# Patient Record
Sex: Female | Born: 1940
Health system: Southern US, Community
[De-identification: ages and names within clinical notes are randomized; demographics above are authoritative.]

## PROBLEM LIST (undated history)

## (undated) DIAGNOSIS — G2581 Restless legs syndrome: Secondary | ICD-10-CM

## (undated) DIAGNOSIS — E669 Obesity, unspecified: Secondary | ICD-10-CM

## (undated) DIAGNOSIS — K219 Gastro-esophageal reflux disease without esophagitis: Secondary | ICD-10-CM

## (undated) DIAGNOSIS — J449 Chronic obstructive pulmonary disease, unspecified: Secondary | ICD-10-CM

## (undated) DIAGNOSIS — I259 Chronic ischemic heart disease, unspecified: Secondary | ICD-10-CM

## (undated) DIAGNOSIS — Z8711 Personal history of peptic ulcer disease: Secondary | ICD-10-CM

## (undated) DIAGNOSIS — I509 Heart failure, unspecified: Secondary | ICD-10-CM

## (undated) DIAGNOSIS — F0390 Unspecified dementia without behavioral disturbance: Secondary | ICD-10-CM

## (undated) DIAGNOSIS — E785 Hyperlipidemia, unspecified: Secondary | ICD-10-CM

## (undated) DIAGNOSIS — E538 Deficiency of other specified B group vitamins: Secondary | ICD-10-CM

## (undated) DIAGNOSIS — I10 Essential (primary) hypertension: Secondary | ICD-10-CM

## (undated) DIAGNOSIS — D649 Anemia, unspecified: Secondary | ICD-10-CM

## (undated) DIAGNOSIS — N183 Chronic kidney disease, stage 3 unspecified: Secondary | ICD-10-CM

## (undated) DIAGNOSIS — M1990 Unspecified osteoarthritis, unspecified site: Secondary | ICD-10-CM

## (undated) DIAGNOSIS — Z8719 Personal history of other diseases of the digestive system: Secondary | ICD-10-CM

## (undated) HISTORY — DX: Anemia, unspecified: D64.9

## (undated) HISTORY — DX: Personal history of other diseases of the digestive system: Z87.19

## (undated) HISTORY — DX: Restless legs syndrome: G25.81

## (undated) HISTORY — DX: Deficiency of other specified B group vitamins: E53.8

## (undated) HISTORY — PX: SHOULDER SURGERY: SHX246

## (undated) HISTORY — DX: Hyperlipidemia, unspecified: E78.5

## (undated) HISTORY — DX: Gastro-esophageal reflux disease without esophagitis: K21.9

## (undated) HISTORY — DX: Unspecified dementia, unspecified severity, without behavioral disturbance, psychotic disturbance, mood disturbance, and anxiety: F03.90

## (undated) HISTORY — DX: Personal history of peptic ulcer disease: Z87.11

## (undated) HISTORY — DX: Chronic obstructive pulmonary disease, unspecified: J44.9

## (undated) HISTORY — PX: OTHER SURGICAL HISTORY: SHX169

## (undated) HISTORY — DX: Unspecified osteoarthritis, unspecified site: M19.90

## (undated) HISTORY — DX: Heart failure, unspecified: I50.9

## (undated) HISTORY — DX: Chronic ischemic heart disease, unspecified: I25.9

## (undated) HISTORY — PX: APPENDECTOMY: SHX54

## (undated) HISTORY — PX: LEG SURGERY: SHX1003

## (undated) HISTORY — PX: CHOLECYSTECTOMY: SHX55

## (undated) HISTORY — DX: Chronic kidney disease, stage 3 unspecified: N18.30

## (undated) HISTORY — DX: Obesity, unspecified: E66.9

---

## 2014-10-21 ENCOUNTER — Encounter: Payer: Self-pay | Admitting: Gastroenterology

## 2015-01-03 DIAGNOSIS — R509 Fever, unspecified: Secondary | ICD-10-CM | POA: Diagnosis not present

## 2015-01-03 DIAGNOSIS — J441 Chronic obstructive pulmonary disease with (acute) exacerbation: Secondary | ICD-10-CM | POA: Diagnosis not present

## 2015-01-03 DIAGNOSIS — M1990 Unspecified osteoarthritis, unspecified site: Secondary | ICD-10-CM | POA: Diagnosis not present

## 2015-01-03 DIAGNOSIS — Z87891 Personal history of nicotine dependence: Secondary | ICD-10-CM | POA: Diagnosis not present

## 2015-01-03 DIAGNOSIS — R0602 Shortness of breath: Secondary | ICD-10-CM | POA: Diagnosis not present

## 2015-01-03 DIAGNOSIS — I10 Essential (primary) hypertension: Secondary | ICD-10-CM | POA: Diagnosis not present

## 2015-01-03 DIAGNOSIS — E780 Pure hypercholesterolemia: Secondary | ICD-10-CM | POA: Diagnosis not present

## 2015-01-03 DIAGNOSIS — J209 Acute bronchitis, unspecified: Secondary | ICD-10-CM | POA: Diagnosis not present

## 2015-01-03 DIAGNOSIS — I2510 Atherosclerotic heart disease of native coronary artery without angina pectoris: Secondary | ICD-10-CM | POA: Diagnosis not present

## 2015-01-03 DIAGNOSIS — Z7722 Contact with and (suspected) exposure to environmental tobacco smoke (acute) (chronic): Secondary | ICD-10-CM | POA: Diagnosis not present

## 2015-01-03 DIAGNOSIS — J439 Emphysema, unspecified: Secondary | ICD-10-CM | POA: Diagnosis not present

## 2015-01-04 DIAGNOSIS — J441 Chronic obstructive pulmonary disease with (acute) exacerbation: Secondary | ICD-10-CM | POA: Diagnosis not present

## 2015-01-04 DIAGNOSIS — Z6835 Body mass index (BMI) 35.0-35.9, adult: Secondary | ICD-10-CM | POA: Diagnosis not present

## 2015-01-04 DIAGNOSIS — Z9981 Dependence on supplemental oxygen: Secondary | ICD-10-CM | POA: Diagnosis not present

## 2015-02-15 DIAGNOSIS — J441 Chronic obstructive pulmonary disease with (acute) exacerbation: Secondary | ICD-10-CM | POA: Diagnosis not present

## 2015-02-25 DIAGNOSIS — D519 Vitamin B12 deficiency anemia, unspecified: Secondary | ICD-10-CM | POA: Diagnosis not present

## 2015-03-02 DIAGNOSIS — I071 Rheumatic tricuspid insufficiency: Secondary | ICD-10-CM | POA: Diagnosis not present

## 2015-03-02 DIAGNOSIS — I5020 Unspecified systolic (congestive) heart failure: Secondary | ICD-10-CM | POA: Diagnosis not present

## 2015-03-02 DIAGNOSIS — I313 Pericardial effusion (noninflammatory): Secondary | ICD-10-CM | POA: Diagnosis not present

## 2015-03-10 DIAGNOSIS — R32 Unspecified urinary incontinence: Secondary | ICD-10-CM | POA: Diagnosis not present

## 2015-03-11 DIAGNOSIS — R0602 Shortness of breath: Secondary | ICD-10-CM | POA: Diagnosis not present

## 2015-03-11 DIAGNOSIS — J309 Allergic rhinitis, unspecified: Secondary | ICD-10-CM | POA: Diagnosis not present

## 2015-03-11 DIAGNOSIS — R600 Localized edema: Secondary | ICD-10-CM | POA: Diagnosis not present

## 2015-03-11 DIAGNOSIS — J449 Chronic obstructive pulmonary disease, unspecified: Secondary | ICD-10-CM | POA: Diagnosis not present

## 2015-03-11 DIAGNOSIS — Z6834 Body mass index (BMI) 34.0-34.9, adult: Secondary | ICD-10-CM | POA: Diagnosis not present

## 2015-03-11 DIAGNOSIS — J90 Pleural effusion, not elsewhere classified: Secondary | ICD-10-CM | POA: Diagnosis not present

## 2015-03-30 DIAGNOSIS — D519 Vitamin B12 deficiency anemia, unspecified: Secondary | ICD-10-CM | POA: Diagnosis not present

## 2015-04-07 DIAGNOSIS — J411 Mucopurulent chronic bronchitis: Secondary | ICD-10-CM | POA: Diagnosis not present

## 2015-04-08 DIAGNOSIS — Z9981 Dependence on supplemental oxygen: Secondary | ICD-10-CM | POA: Diagnosis not present

## 2015-04-08 DIAGNOSIS — J441 Chronic obstructive pulmonary disease with (acute) exacerbation: Secondary | ICD-10-CM | POA: Diagnosis not present

## 2015-04-08 DIAGNOSIS — R600 Localized edema: Secondary | ICD-10-CM | POA: Diagnosis not present

## 2015-04-08 DIAGNOSIS — I129 Hypertensive chronic kidney disease with stage 1 through stage 4 chronic kidney disease, or unspecified chronic kidney disease: Secondary | ICD-10-CM | POA: Diagnosis not present

## 2015-05-06 DIAGNOSIS — D519 Vitamin B12 deficiency anemia, unspecified: Secondary | ICD-10-CM | POA: Diagnosis not present

## 2015-05-07 DIAGNOSIS — J411 Mucopurulent chronic bronchitis: Secondary | ICD-10-CM | POA: Diagnosis not present

## 2015-06-07 DIAGNOSIS — J411 Mucopurulent chronic bronchitis: Secondary | ICD-10-CM | POA: Diagnosis not present

## 2015-07-07 DIAGNOSIS — R32 Unspecified urinary incontinence: Secondary | ICD-10-CM | POA: Diagnosis not present

## 2015-07-07 DIAGNOSIS — J411 Mucopurulent chronic bronchitis: Secondary | ICD-10-CM | POA: Diagnosis not present

## 2015-07-19 DIAGNOSIS — R32 Unspecified urinary incontinence: Secondary | ICD-10-CM | POA: Diagnosis not present

## 2015-07-20 DIAGNOSIS — D509 Iron deficiency anemia, unspecified: Secondary | ICD-10-CM | POA: Diagnosis not present

## 2015-07-20 DIAGNOSIS — D519 Vitamin B12 deficiency anemia, unspecified: Secondary | ICD-10-CM | POA: Diagnosis not present

## 2015-07-20 DIAGNOSIS — N390 Urinary tract infection, site not specified: Secondary | ICD-10-CM | POA: Diagnosis not present

## 2015-07-20 DIAGNOSIS — R109 Unspecified abdominal pain: Secondary | ICD-10-CM | POA: Diagnosis not present

## 2015-07-31 DIAGNOSIS — R32 Unspecified urinary incontinence: Secondary | ICD-10-CM | POA: Diagnosis not present

## 2015-08-07 DIAGNOSIS — J411 Mucopurulent chronic bronchitis: Secondary | ICD-10-CM | POA: Diagnosis not present

## 2015-08-26 DIAGNOSIS — R600 Localized edema: Secondary | ICD-10-CM | POA: Diagnosis not present

## 2015-08-26 DIAGNOSIS — R32 Unspecified urinary incontinence: Secondary | ICD-10-CM | POA: Diagnosis not present

## 2015-08-26 DIAGNOSIS — L928 Other granulomatous disorders of the skin and subcutaneous tissue: Secondary | ICD-10-CM | POA: Diagnosis not present

## 2015-08-26 DIAGNOSIS — K449 Diaphragmatic hernia without obstruction or gangrene: Secondary | ICD-10-CM | POA: Diagnosis not present

## 2015-08-26 DIAGNOSIS — R109 Unspecified abdominal pain: Secondary | ICD-10-CM | POA: Diagnosis not present

## 2015-08-26 DIAGNOSIS — R1031 Right lower quadrant pain: Secondary | ICD-10-CM | POA: Diagnosis not present

## 2015-08-26 DIAGNOSIS — R319 Hematuria, unspecified: Secondary | ICD-10-CM | POA: Diagnosis not present

## 2015-09-03 DIAGNOSIS — R32 Unspecified urinary incontinence: Secondary | ICD-10-CM | POA: Diagnosis not present

## 2015-09-07 DIAGNOSIS — J411 Mucopurulent chronic bronchitis: Secondary | ICD-10-CM | POA: Diagnosis not present

## 2015-09-08 DIAGNOSIS — S39011A Strain of muscle, fascia and tendon of abdomen, initial encounter: Secondary | ICD-10-CM | POA: Diagnosis not present

## 2015-09-08 DIAGNOSIS — J449 Chronic obstructive pulmonary disease, unspecified: Secondary | ICD-10-CM | POA: Diagnosis not present

## 2015-09-08 DIAGNOSIS — R109 Unspecified abdominal pain: Secondary | ICD-10-CM | POA: Diagnosis not present

## 2015-09-08 DIAGNOSIS — D519 Vitamin B12 deficiency anemia, unspecified: Secondary | ICD-10-CM | POA: Diagnosis not present

## 2015-09-08 DIAGNOSIS — R1031 Right lower quadrant pain: Secondary | ICD-10-CM | POA: Diagnosis not present

## 2015-09-08 DIAGNOSIS — R1011 Right upper quadrant pain: Secondary | ICD-10-CM | POA: Diagnosis not present

## 2015-09-08 DIAGNOSIS — R319 Hematuria, unspecified: Secondary | ICD-10-CM | POA: Diagnosis not present

## 2015-09-24 DIAGNOSIS — R32 Unspecified urinary incontinence: Secondary | ICD-10-CM | POA: Diagnosis not present

## 2015-09-30 DIAGNOSIS — D519 Vitamin B12 deficiency anemia, unspecified: Secondary | ICD-10-CM | POA: Diagnosis not present

## 2015-09-30 DIAGNOSIS — Z23 Encounter for immunization: Secondary | ICD-10-CM | POA: Diagnosis not present

## 2015-10-01 DIAGNOSIS — R32 Unspecified urinary incontinence: Secondary | ICD-10-CM | POA: Diagnosis not present

## 2015-10-07 DIAGNOSIS — J411 Mucopurulent chronic bronchitis: Secondary | ICD-10-CM | POA: Diagnosis not present

## 2015-10-11 DIAGNOSIS — K648 Other hemorrhoids: Secondary | ICD-10-CM | POA: Diagnosis not present

## 2015-10-11 DIAGNOSIS — K5900 Constipation, unspecified: Secondary | ICD-10-CM | POA: Diagnosis not present

## 2015-10-11 DIAGNOSIS — K219 Gastro-esophageal reflux disease without esophagitis: Secondary | ICD-10-CM | POA: Diagnosis not present

## 2015-10-11 DIAGNOSIS — K222 Esophageal obstruction: Secondary | ICD-10-CM | POA: Diagnosis not present

## 2015-11-07 DIAGNOSIS — J411 Mucopurulent chronic bronchitis: Secondary | ICD-10-CM | POA: Diagnosis not present

## 2015-11-24 DIAGNOSIS — Z9181 History of falling: Secondary | ICD-10-CM | POA: Diagnosis not present

## 2015-11-24 DIAGNOSIS — Z139 Encounter for screening, unspecified: Secondary | ICD-10-CM | POA: Diagnosis not present

## 2015-11-24 DIAGNOSIS — Z1389 Encounter for screening for other disorder: Secondary | ICD-10-CM | POA: Diagnosis not present

## 2015-11-24 DIAGNOSIS — H579 Unspecified disorder of eye and adnexa: Secondary | ICD-10-CM | POA: Diagnosis not present

## 2015-11-24 DIAGNOSIS — J441 Chronic obstructive pulmonary disease with (acute) exacerbation: Secondary | ICD-10-CM | POA: Diagnosis not present

## 2015-11-24 DIAGNOSIS — Z0000 Encounter for general adult medical examination without abnormal findings: Secondary | ICD-10-CM | POA: Diagnosis not present

## 2015-12-06 DIAGNOSIS — I517 Cardiomegaly: Secondary | ICD-10-CM | POA: Diagnosis not present

## 2015-12-06 DIAGNOSIS — R05 Cough: Secondary | ICD-10-CM | POA: Diagnosis not present

## 2015-12-06 DIAGNOSIS — R918 Other nonspecific abnormal finding of lung field: Secondary | ICD-10-CM | POA: Diagnosis not present

## 2015-12-06 DIAGNOSIS — Z6835 Body mass index (BMI) 35.0-35.9, adult: Secondary | ICD-10-CM | POA: Diagnosis not present

## 2015-12-07 DIAGNOSIS — J411 Mucopurulent chronic bronchitis: Secondary | ICD-10-CM | POA: Diagnosis not present

## 2015-12-08 DIAGNOSIS — N182 Chronic kidney disease, stage 2 (mild): Secondary | ICD-10-CM | POA: Diagnosis not present

## 2015-12-08 DIAGNOSIS — I129 Hypertensive chronic kidney disease with stage 1 through stage 4 chronic kidney disease, or unspecified chronic kidney disease: Secondary | ICD-10-CM | POA: Diagnosis not present

## 2015-12-08 DIAGNOSIS — E782 Mixed hyperlipidemia: Secondary | ICD-10-CM | POA: Diagnosis not present

## 2015-12-16 DIAGNOSIS — J449 Chronic obstructive pulmonary disease, unspecified: Secondary | ICD-10-CM | POA: Diagnosis not present

## 2015-12-16 DIAGNOSIS — I10 Essential (primary) hypertension: Secondary | ICD-10-CM | POA: Diagnosis not present

## 2015-12-16 DIAGNOSIS — I2510 Atherosclerotic heart disease of native coronary artery without angina pectoris: Secondary | ICD-10-CM | POA: Diagnosis not present

## 2015-12-16 DIAGNOSIS — E785 Hyperlipidemia, unspecified: Secondary | ICD-10-CM | POA: Diagnosis not present

## 2015-12-17 DIAGNOSIS — D509 Iron deficiency anemia, unspecified: Secondary | ICD-10-CM | POA: Diagnosis not present

## 2015-12-30 DIAGNOSIS — R32 Unspecified urinary incontinence: Secondary | ICD-10-CM | POA: Diagnosis not present

## 2016-01-07 DIAGNOSIS — H2512 Age-related nuclear cataract, left eye: Secondary | ICD-10-CM | POA: Diagnosis not present

## 2016-01-07 DIAGNOSIS — J411 Mucopurulent chronic bronchitis: Secondary | ICD-10-CM | POA: Diagnosis not present

## 2016-01-19 DIAGNOSIS — D509 Iron deficiency anemia, unspecified: Secondary | ICD-10-CM | POA: Diagnosis not present

## 2016-01-21 DIAGNOSIS — Z9981 Dependence on supplemental oxygen: Secondary | ICD-10-CM | POA: Diagnosis not present

## 2016-01-21 DIAGNOSIS — Z6834 Body mass index (BMI) 34.0-34.9, adult: Secondary | ICD-10-CM | POA: Diagnosis not present

## 2016-01-21 DIAGNOSIS — J9610 Chronic respiratory failure, unspecified whether with hypoxia or hypercapnia: Secondary | ICD-10-CM | POA: Diagnosis not present

## 2016-01-21 DIAGNOSIS — J441 Chronic obstructive pulmonary disease with (acute) exacerbation: Secondary | ICD-10-CM | POA: Diagnosis not present

## 2016-01-26 DIAGNOSIS — D509 Iron deficiency anemia, unspecified: Secondary | ICD-10-CM | POA: Diagnosis not present

## 2016-02-07 DIAGNOSIS — J411 Mucopurulent chronic bronchitis: Secondary | ICD-10-CM | POA: Diagnosis not present

## 2016-02-15 DIAGNOSIS — J449 Chronic obstructive pulmonary disease, unspecified: Secondary | ICD-10-CM | POA: Diagnosis not present

## 2016-02-24 DIAGNOSIS — F0150 Vascular dementia without behavioral disturbance: Secondary | ICD-10-CM | POA: Diagnosis not present

## 2016-02-24 DIAGNOSIS — M7500 Adhesive capsulitis of unspecified shoulder: Secondary | ICD-10-CM | POA: Diagnosis not present

## 2016-02-24 DIAGNOSIS — Z6833 Body mass index (BMI) 33.0-33.9, adult: Secondary | ICD-10-CM | POA: Diagnosis not present

## 2016-02-24 DIAGNOSIS — J449 Chronic obstructive pulmonary disease, unspecified: Secondary | ICD-10-CM | POA: Diagnosis not present

## 2016-02-28 DIAGNOSIS — S42309A Unspecified fracture of shaft of humerus, unspecified arm, initial encounter for closed fracture: Secondary | ICD-10-CM | POA: Diagnosis not present

## 2016-02-28 DIAGNOSIS — R32 Unspecified urinary incontinence: Secondary | ICD-10-CM | POA: Diagnosis not present

## 2016-03-03 DIAGNOSIS — D509 Iron deficiency anemia, unspecified: Secondary | ICD-10-CM | POA: Diagnosis not present

## 2016-03-06 DIAGNOSIS — J411 Mucopurulent chronic bronchitis: Secondary | ICD-10-CM | POA: Diagnosis not present

## 2016-03-08 DIAGNOSIS — D519 Vitamin B12 deficiency anemia, unspecified: Secondary | ICD-10-CM | POA: Diagnosis not present

## 2016-03-14 DIAGNOSIS — J449 Chronic obstructive pulmonary disease, unspecified: Secondary | ICD-10-CM | POA: Diagnosis not present

## 2016-04-06 DIAGNOSIS — J411 Mucopurulent chronic bronchitis: Secondary | ICD-10-CM | POA: Diagnosis not present

## 2016-04-06 DIAGNOSIS — D519 Vitamin B12 deficiency anemia, unspecified: Secondary | ICD-10-CM | POA: Diagnosis not present

## 2016-04-14 DIAGNOSIS — J449 Chronic obstructive pulmonary disease, unspecified: Secondary | ICD-10-CM | POA: Diagnosis not present

## 2016-04-20 DIAGNOSIS — Z6834 Body mass index (BMI) 34.0-34.9, adult: Secondary | ICD-10-CM | POA: Diagnosis not present

## 2016-04-20 DIAGNOSIS — F0150 Vascular dementia without behavioral disturbance: Secondary | ICD-10-CM | POA: Diagnosis not present

## 2016-04-28 DIAGNOSIS — R32 Unspecified urinary incontinence: Secondary | ICD-10-CM | POA: Diagnosis not present

## 2016-05-06 DIAGNOSIS — J411 Mucopurulent chronic bronchitis: Secondary | ICD-10-CM | POA: Diagnosis not present

## 2016-05-10 DIAGNOSIS — D519 Vitamin B12 deficiency anemia, unspecified: Secondary | ICD-10-CM | POA: Diagnosis not present

## 2016-05-14 DIAGNOSIS — J449 Chronic obstructive pulmonary disease, unspecified: Secondary | ICD-10-CM | POA: Diagnosis not present

## 2016-05-19 DIAGNOSIS — R600 Localized edema: Secondary | ICD-10-CM | POA: Diagnosis not present

## 2016-05-19 DIAGNOSIS — D509 Iron deficiency anemia, unspecified: Secondary | ICD-10-CM | POA: Diagnosis not present

## 2016-05-19 DIAGNOSIS — Z9981 Dependence on supplemental oxygen: Secondary | ICD-10-CM | POA: Diagnosis not present

## 2016-05-19 DIAGNOSIS — J9610 Chronic respiratory failure, unspecified whether with hypoxia or hypercapnia: Secondary | ICD-10-CM | POA: Diagnosis not present

## 2016-06-01 DIAGNOSIS — D509 Iron deficiency anemia, unspecified: Secondary | ICD-10-CM | POA: Diagnosis not present

## 2016-06-02 DIAGNOSIS — D509 Iron deficiency anemia, unspecified: Secondary | ICD-10-CM | POA: Diagnosis not present

## 2016-06-06 DIAGNOSIS — J411 Mucopurulent chronic bronchitis: Secondary | ICD-10-CM | POA: Diagnosis not present

## 2016-06-08 DIAGNOSIS — D519 Vitamin B12 deficiency anemia, unspecified: Secondary | ICD-10-CM | POA: Diagnosis not present

## 2016-06-09 DIAGNOSIS — D509 Iron deficiency anemia, unspecified: Secondary | ICD-10-CM | POA: Diagnosis not present

## 2016-06-14 DIAGNOSIS — J449 Chronic obstructive pulmonary disease, unspecified: Secondary | ICD-10-CM | POA: Diagnosis not present

## 2016-06-14 DIAGNOSIS — D509 Iron deficiency anemia, unspecified: Secondary | ICD-10-CM | POA: Diagnosis not present

## 2016-06-21 DIAGNOSIS — R32 Unspecified urinary incontinence: Secondary | ICD-10-CM | POA: Diagnosis not present

## 2016-07-03 DIAGNOSIS — F0391 Unspecified dementia with behavioral disturbance: Secondary | ICD-10-CM | POA: Diagnosis not present

## 2016-07-03 DIAGNOSIS — D519 Vitamin B12 deficiency anemia, unspecified: Secondary | ICD-10-CM | POA: Diagnosis not present

## 2016-07-03 DIAGNOSIS — Z139 Encounter for screening, unspecified: Secondary | ICD-10-CM | POA: Diagnosis not present

## 2016-07-03 DIAGNOSIS — R600 Localized edema: Secondary | ICD-10-CM | POA: Diagnosis not present

## 2016-07-03 DIAGNOSIS — Z7189 Other specified counseling: Secondary | ICD-10-CM | POA: Diagnosis not present

## 2016-07-03 DIAGNOSIS — Z0000 Encounter for general adult medical examination without abnormal findings: Secondary | ICD-10-CM | POA: Diagnosis not present

## 2016-07-03 DIAGNOSIS — Z1389 Encounter for screening for other disorder: Secondary | ICD-10-CM | POA: Diagnosis not present

## 2016-07-06 DIAGNOSIS — J411 Mucopurulent chronic bronchitis: Secondary | ICD-10-CM | POA: Diagnosis not present

## 2016-07-14 DIAGNOSIS — J449 Chronic obstructive pulmonary disease, unspecified: Secondary | ICD-10-CM | POA: Diagnosis not present

## 2016-08-06 DIAGNOSIS — J411 Mucopurulent chronic bronchitis: Secondary | ICD-10-CM | POA: Diagnosis not present

## 2016-08-14 DIAGNOSIS — J449 Chronic obstructive pulmonary disease, unspecified: Secondary | ICD-10-CM | POA: Diagnosis not present

## 2016-08-29 DIAGNOSIS — R32 Unspecified urinary incontinence: Secondary | ICD-10-CM | POA: Diagnosis not present

## 2016-09-04 DIAGNOSIS — N390 Urinary tract infection, site not specified: Secondary | ICD-10-CM | POA: Diagnosis not present

## 2016-09-04 DIAGNOSIS — N183 Chronic kidney disease, stage 3 (moderate): Secondary | ICD-10-CM | POA: Diagnosis not present

## 2016-09-04 DIAGNOSIS — D509 Iron deficiency anemia, unspecified: Secondary | ICD-10-CM | POA: Diagnosis not present

## 2016-09-04 DIAGNOSIS — Z6835 Body mass index (BMI) 35.0-35.9, adult: Secondary | ICD-10-CM | POA: Diagnosis not present

## 2016-09-04 DIAGNOSIS — M549 Dorsalgia, unspecified: Secondary | ICD-10-CM | POA: Diagnosis not present

## 2016-09-06 DIAGNOSIS — J411 Mucopurulent chronic bronchitis: Secondary | ICD-10-CM | POA: Diagnosis not present

## 2016-09-14 DIAGNOSIS — J449 Chronic obstructive pulmonary disease, unspecified: Secondary | ICD-10-CM | POA: Diagnosis not present

## 2016-09-29 DIAGNOSIS — D519 Vitamin B12 deficiency anemia, unspecified: Secondary | ICD-10-CM | POA: Diagnosis not present

## 2016-10-06 DIAGNOSIS — J411 Mucopurulent chronic bronchitis: Secondary | ICD-10-CM | POA: Diagnosis not present

## 2016-10-14 DIAGNOSIS — J449 Chronic obstructive pulmonary disease, unspecified: Secondary | ICD-10-CM | POA: Diagnosis not present

## 2016-10-25 DIAGNOSIS — R32 Unspecified urinary incontinence: Secondary | ICD-10-CM | POA: Diagnosis not present

## 2016-11-06 DIAGNOSIS — J411 Mucopurulent chronic bronchitis: Secondary | ICD-10-CM | POA: Diagnosis not present

## 2016-11-08 DIAGNOSIS — J321 Chronic frontal sinusitis: Secondary | ICD-10-CM | POA: Diagnosis not present

## 2016-11-08 DIAGNOSIS — Z6834 Body mass index (BMI) 34.0-34.9, adult: Secondary | ICD-10-CM | POA: Diagnosis not present

## 2016-11-14 DIAGNOSIS — J449 Chronic obstructive pulmonary disease, unspecified: Secondary | ICD-10-CM | POA: Diagnosis not present

## 2016-12-06 DIAGNOSIS — J411 Mucopurulent chronic bronchitis: Secondary | ICD-10-CM | POA: Diagnosis not present

## 2016-12-07 DIAGNOSIS — Z6835 Body mass index (BMI) 35.0-35.9, adult: Secondary | ICD-10-CM | POA: Diagnosis not present

## 2016-12-07 DIAGNOSIS — J320 Chronic maxillary sinusitis: Secondary | ICD-10-CM | POA: Diagnosis not present

## 2016-12-10 DIAGNOSIS — J441 Chronic obstructive pulmonary disease with (acute) exacerbation: Secondary | ICD-10-CM | POA: Diagnosis not present

## 2016-12-10 DIAGNOSIS — R0602 Shortness of breath: Secondary | ICD-10-CM | POA: Diagnosis not present

## 2016-12-10 DIAGNOSIS — Z9981 Dependence on supplemental oxygen: Secondary | ICD-10-CM | POA: Diagnosis not present

## 2016-12-10 DIAGNOSIS — R05 Cough: Secondary | ICD-10-CM | POA: Diagnosis not present

## 2016-12-14 DIAGNOSIS — J449 Chronic obstructive pulmonary disease, unspecified: Secondary | ICD-10-CM | POA: Diagnosis not present

## 2016-12-27 DIAGNOSIS — R32 Unspecified urinary incontinence: Secondary | ICD-10-CM | POA: Diagnosis not present

## 2017-01-06 DIAGNOSIS — J411 Mucopurulent chronic bronchitis: Secondary | ICD-10-CM | POA: Diagnosis not present

## 2017-01-14 DIAGNOSIS — J449 Chronic obstructive pulmonary disease, unspecified: Secondary | ICD-10-CM | POA: Diagnosis not present

## 2017-01-17 DIAGNOSIS — G2581 Restless legs syndrome: Secondary | ICD-10-CM | POA: Diagnosis not present

## 2017-01-17 DIAGNOSIS — M170 Bilateral primary osteoarthritis of knee: Secondary | ICD-10-CM | POA: Diagnosis not present

## 2017-01-20 DIAGNOSIS — G4489 Other headache syndrome: Secondary | ICD-10-CM | POA: Diagnosis not present

## 2017-01-20 DIAGNOSIS — R404 Transient alteration of awareness: Secondary | ICD-10-CM | POA: Diagnosis not present

## 2017-01-20 DIAGNOSIS — D72820 Lymphocytosis (symptomatic): Secondary | ICD-10-CM | POA: Diagnosis not present

## 2017-01-20 DIAGNOSIS — R509 Fever, unspecified: Secondary | ICD-10-CM | POA: Diagnosis not present

## 2017-01-20 DIAGNOSIS — K317 Polyp of stomach and duodenum: Secondary | ICD-10-CM | POA: Diagnosis not present

## 2017-01-20 DIAGNOSIS — I10 Essential (primary) hypertension: Secondary | ICD-10-CM | POA: Diagnosis not present

## 2017-01-20 DIAGNOSIS — I252 Old myocardial infarction: Secondary | ICD-10-CM | POA: Diagnosis not present

## 2017-01-20 DIAGNOSIS — M069 Rheumatoid arthritis, unspecified: Secondary | ICD-10-CM | POA: Diagnosis not present

## 2017-01-20 DIAGNOSIS — K920 Hematemesis: Secondary | ICD-10-CM | POA: Diagnosis not present

## 2017-01-20 DIAGNOSIS — R195 Other fecal abnormalities: Secondary | ICD-10-CM

## 2017-01-20 DIAGNOSIS — K219 Gastro-esophageal reflux disease without esophagitis: Secondary | ICD-10-CM | POA: Diagnosis not present

## 2017-01-20 DIAGNOSIS — R531 Weakness: Secondary | ICD-10-CM | POA: Diagnosis not present

## 2017-01-20 DIAGNOSIS — K254 Chronic or unspecified gastric ulcer with hemorrhage: Secondary | ICD-10-CM | POA: Diagnosis not present

## 2017-01-20 DIAGNOSIS — K449 Diaphragmatic hernia without obstruction or gangrene: Secondary | ICD-10-CM | POA: Diagnosis not present

## 2017-01-20 DIAGNOSIS — D509 Iron deficiency anemia, unspecified: Secondary | ICD-10-CM | POA: Diagnosis not present

## 2017-01-20 DIAGNOSIS — R81 Glycosuria: Secondary | ICD-10-CM | POA: Diagnosis not present

## 2017-01-20 DIAGNOSIS — D690 Allergic purpura: Secondary | ICD-10-CM | POA: Diagnosis not present

## 2017-01-20 DIAGNOSIS — R51 Headache: Secondary | ICD-10-CM | POA: Diagnosis not present

## 2017-01-20 DIAGNOSIS — J449 Chronic obstructive pulmonary disease, unspecified: Secondary | ICD-10-CM | POA: Diagnosis not present

## 2017-01-20 DIAGNOSIS — D649 Anemia, unspecified: Secondary | ICD-10-CM | POA: Diagnosis not present

## 2017-01-20 DIAGNOSIS — K259 Gastric ulcer, unspecified as acute or chronic, without hemorrhage or perforation: Secondary | ICD-10-CM | POA: Diagnosis not present

## 2017-01-20 DIAGNOSIS — D62 Acute posthemorrhagic anemia: Secondary | ICD-10-CM | POA: Diagnosis not present

## 2017-01-21 DIAGNOSIS — K317 Polyp of stomach and duodenum: Secondary | ICD-10-CM | POA: Diagnosis not present

## 2017-01-21 DIAGNOSIS — D72820 Lymphocytosis (symptomatic): Secondary | ICD-10-CM | POA: Diagnosis not present

## 2017-01-25 DIAGNOSIS — D649 Anemia, unspecified: Secondary | ICD-10-CM | POA: Diagnosis not present

## 2017-01-25 DIAGNOSIS — Z6834 Body mass index (BMI) 34.0-34.9, adult: Secondary | ICD-10-CM | POA: Diagnosis not present

## 2017-01-25 DIAGNOSIS — K922 Gastrointestinal hemorrhage, unspecified: Secondary | ICD-10-CM | POA: Diagnosis not present

## 2017-01-26 DIAGNOSIS — R195 Other fecal abnormalities: Secondary | ICD-10-CM | POA: Diagnosis not present

## 2017-01-26 DIAGNOSIS — M069 Rheumatoid arthritis, unspecified: Secondary | ICD-10-CM | POA: Diagnosis not present

## 2017-01-26 DIAGNOSIS — I252 Old myocardial infarction: Secondary | ICD-10-CM | POA: Diagnosis not present

## 2017-01-26 DIAGNOSIS — J449 Chronic obstructive pulmonary disease, unspecified: Secondary | ICD-10-CM | POA: Diagnosis not present

## 2017-01-26 DIAGNOSIS — D509 Iron deficiency anemia, unspecified: Secondary | ICD-10-CM | POA: Diagnosis not present

## 2017-01-26 DIAGNOSIS — I10 Essential (primary) hypertension: Secondary | ICD-10-CM | POA: Diagnosis not present

## 2017-01-26 DIAGNOSIS — F0390 Unspecified dementia without behavioral disturbance: Secondary | ICD-10-CM | POA: Diagnosis not present

## 2017-01-26 DIAGNOSIS — Z87891 Personal history of nicotine dependence: Secondary | ICD-10-CM | POA: Diagnosis not present

## 2017-01-26 DIAGNOSIS — Z79899 Other long term (current) drug therapy: Secondary | ICD-10-CM | POA: Diagnosis not present

## 2017-01-27 DIAGNOSIS — Z87891 Personal history of nicotine dependence: Secondary | ICD-10-CM | POA: Diagnosis not present

## 2017-01-27 DIAGNOSIS — R195 Other fecal abnormalities: Secondary | ICD-10-CM | POA: Diagnosis not present

## 2017-01-27 DIAGNOSIS — J449 Chronic obstructive pulmonary disease, unspecified: Secondary | ICD-10-CM | POA: Diagnosis not present

## 2017-01-27 DIAGNOSIS — Z79899 Other long term (current) drug therapy: Secondary | ICD-10-CM | POA: Diagnosis not present

## 2017-01-27 DIAGNOSIS — F0390 Unspecified dementia without behavioral disturbance: Secondary | ICD-10-CM | POA: Diagnosis not present

## 2017-01-27 DIAGNOSIS — I10 Essential (primary) hypertension: Secondary | ICD-10-CM | POA: Diagnosis not present

## 2017-01-27 DIAGNOSIS — I252 Old myocardial infarction: Secondary | ICD-10-CM | POA: Diagnosis not present

## 2017-01-27 DIAGNOSIS — D509 Iron deficiency anemia, unspecified: Secondary | ICD-10-CM | POA: Diagnosis not present

## 2017-01-27 DIAGNOSIS — M069 Rheumatoid arthritis, unspecified: Secondary | ICD-10-CM | POA: Diagnosis not present

## 2017-01-27 DIAGNOSIS — D649 Anemia, unspecified: Secondary | ICD-10-CM | POA: Diagnosis not present

## 2017-01-28 DIAGNOSIS — M069 Rheumatoid arthritis, unspecified: Secondary | ICD-10-CM | POA: Diagnosis not present

## 2017-01-28 DIAGNOSIS — I252 Old myocardial infarction: Secondary | ICD-10-CM | POA: Diagnosis not present

## 2017-01-28 DIAGNOSIS — D509 Iron deficiency anemia, unspecified: Secondary | ICD-10-CM | POA: Diagnosis not present

## 2017-01-28 DIAGNOSIS — Z79899 Other long term (current) drug therapy: Secondary | ICD-10-CM | POA: Diagnosis not present

## 2017-01-28 DIAGNOSIS — Z87891 Personal history of nicotine dependence: Secondary | ICD-10-CM | POA: Diagnosis not present

## 2017-01-28 DIAGNOSIS — F0390 Unspecified dementia without behavioral disturbance: Secondary | ICD-10-CM | POA: Diagnosis not present

## 2017-01-28 DIAGNOSIS — J449 Chronic obstructive pulmonary disease, unspecified: Secondary | ICD-10-CM | POA: Diagnosis not present

## 2017-01-28 DIAGNOSIS — I10 Essential (primary) hypertension: Secondary | ICD-10-CM | POA: Diagnosis not present

## 2017-01-28 DIAGNOSIS — R195 Other fecal abnormalities: Secondary | ICD-10-CM | POA: Diagnosis not present

## 2017-02-06 DIAGNOSIS — R1032 Left lower quadrant pain: Secondary | ICD-10-CM | POA: Diagnosis not present

## 2017-02-06 DIAGNOSIS — Z8711 Personal history of peptic ulcer disease: Secondary | ICD-10-CM | POA: Diagnosis not present

## 2017-02-06 DIAGNOSIS — J411 Mucopurulent chronic bronchitis: Secondary | ICD-10-CM | POA: Diagnosis not present

## 2017-02-06 DIAGNOSIS — D500 Iron deficiency anemia secondary to blood loss (chronic): Secondary | ICD-10-CM | POA: Diagnosis not present

## 2017-02-06 DIAGNOSIS — D649 Anemia, unspecified: Secondary | ICD-10-CM | POA: Diagnosis not present

## 2017-02-06 DIAGNOSIS — Z8719 Personal history of other diseases of the digestive system: Secondary | ICD-10-CM | POA: Diagnosis not present

## 2017-02-14 DIAGNOSIS — E784 Other hyperlipidemia: Secondary | ICD-10-CM | POA: Diagnosis not present

## 2017-02-14 DIAGNOSIS — I5020 Unspecified systolic (congestive) heart failure: Secondary | ICD-10-CM | POA: Diagnosis not present

## 2017-02-14 DIAGNOSIS — I10 Essential (primary) hypertension: Secondary | ICD-10-CM | POA: Diagnosis not present

## 2017-02-14 DIAGNOSIS — J410 Simple chronic bronchitis: Secondary | ICD-10-CM | POA: Diagnosis not present

## 2017-02-14 DIAGNOSIS — R0609 Other forms of dyspnea: Secondary | ICD-10-CM | POA: Diagnosis not present

## 2017-02-14 DIAGNOSIS — J449 Chronic obstructive pulmonary disease, unspecified: Secondary | ICD-10-CM | POA: Diagnosis not present

## 2017-02-14 DIAGNOSIS — Z01818 Encounter for other preprocedural examination: Secondary | ICD-10-CM | POA: Diagnosis not present

## 2017-02-19 DIAGNOSIS — R0609 Other forms of dyspnea: Secondary | ICD-10-CM | POA: Diagnosis not present

## 2017-02-19 DIAGNOSIS — J410 Simple chronic bronchitis: Secondary | ICD-10-CM | POA: Diagnosis not present

## 2017-02-19 DIAGNOSIS — I5020 Unspecified systolic (congestive) heart failure: Secondary | ICD-10-CM | POA: Diagnosis not present

## 2017-02-19 DIAGNOSIS — Z01818 Encounter for other preprocedural examination: Secondary | ICD-10-CM | POA: Diagnosis not present

## 2017-02-19 DIAGNOSIS — I10 Essential (primary) hypertension: Secondary | ICD-10-CM | POA: Diagnosis not present

## 2017-02-19 DIAGNOSIS — E784 Other hyperlipidemia: Secondary | ICD-10-CM | POA: Diagnosis not present

## 2017-02-20 DIAGNOSIS — R1032 Left lower quadrant pain: Secondary | ICD-10-CM | POA: Diagnosis not present

## 2017-02-20 DIAGNOSIS — R109 Unspecified abdominal pain: Secondary | ICD-10-CM | POA: Diagnosis not present

## 2017-02-20 DIAGNOSIS — D500 Iron deficiency anemia secondary to blood loss (chronic): Secondary | ICD-10-CM | POA: Diagnosis not present

## 2017-02-23 DIAGNOSIS — D500 Iron deficiency anemia secondary to blood loss (chronic): Secondary | ICD-10-CM | POA: Diagnosis not present

## 2017-02-26 DIAGNOSIS — R32 Unspecified urinary incontinence: Secondary | ICD-10-CM | POA: Diagnosis not present

## 2017-02-27 DIAGNOSIS — R32 Unspecified urinary incontinence: Secondary | ICD-10-CM | POA: Diagnosis not present

## 2017-03-06 DIAGNOSIS — J411 Mucopurulent chronic bronchitis: Secondary | ICD-10-CM | POA: Diagnosis not present

## 2017-03-08 DIAGNOSIS — D500 Iron deficiency anemia secondary to blood loss (chronic): Secondary | ICD-10-CM | POA: Diagnosis not present

## 2017-03-09 DIAGNOSIS — J029 Acute pharyngitis, unspecified: Secondary | ICD-10-CM | POA: Diagnosis not present

## 2017-03-09 DIAGNOSIS — J069 Acute upper respiratory infection, unspecified: Secondary | ICD-10-CM | POA: Diagnosis not present

## 2017-03-09 DIAGNOSIS — D508 Other iron deficiency anemias: Secondary | ICD-10-CM | POA: Diagnosis not present

## 2017-03-09 DIAGNOSIS — R05 Cough: Secondary | ICD-10-CM | POA: Diagnosis not present

## 2017-03-12 DIAGNOSIS — D509 Iron deficiency anemia, unspecified: Secondary | ICD-10-CM | POA: Diagnosis not present

## 2017-03-12 DIAGNOSIS — I2510 Atherosclerotic heart disease of native coronary artery without angina pectoris: Secondary | ICD-10-CM | POA: Diagnosis not present

## 2017-03-12 DIAGNOSIS — R0602 Shortness of breath: Secondary | ICD-10-CM | POA: Diagnosis not present

## 2017-03-12 DIAGNOSIS — K219 Gastro-esophageal reflux disease without esophagitis: Secondary | ICD-10-CM | POA: Diagnosis not present

## 2017-03-12 DIAGNOSIS — F0390 Unspecified dementia without behavioral disturbance: Secondary | ICD-10-CM | POA: Diagnosis not present

## 2017-03-12 DIAGNOSIS — D62 Acute posthemorrhagic anemia: Secondary | ICD-10-CM | POA: Diagnosis not present

## 2017-03-12 DIAGNOSIS — J449 Chronic obstructive pulmonary disease, unspecified: Secondary | ICD-10-CM | POA: Diagnosis not present

## 2017-03-12 DIAGNOSIS — E7800 Pure hypercholesterolemia, unspecified: Secondary | ICD-10-CM | POA: Diagnosis not present

## 2017-03-12 DIAGNOSIS — K922 Gastrointestinal hemorrhage, unspecified: Secondary | ICD-10-CM | POA: Diagnosis not present

## 2017-03-12 DIAGNOSIS — I10 Essential (primary) hypertension: Secondary | ICD-10-CM | POA: Diagnosis not present

## 2017-03-13 DIAGNOSIS — D509 Iron deficiency anemia, unspecified: Secondary | ICD-10-CM | POA: Diagnosis not present

## 2017-03-13 DIAGNOSIS — I6523 Occlusion and stenosis of bilateral carotid arteries: Secondary | ICD-10-CM | POA: Diagnosis not present

## 2017-03-14 DIAGNOSIS — D509 Iron deficiency anemia, unspecified: Secondary | ICD-10-CM | POA: Diagnosis not present

## 2017-03-14 DIAGNOSIS — K922 Gastrointestinal hemorrhage, unspecified: Secondary | ICD-10-CM | POA: Diagnosis not present

## 2017-03-14 DIAGNOSIS — J449 Chronic obstructive pulmonary disease, unspecified: Secondary | ICD-10-CM | POA: Diagnosis not present

## 2017-03-14 DIAGNOSIS — D62 Acute posthemorrhagic anemia: Secondary | ICD-10-CM | POA: Diagnosis not present

## 2017-03-15 DIAGNOSIS — D509 Iron deficiency anemia, unspecified: Secondary | ICD-10-CM | POA: Diagnosis not present

## 2017-03-15 DIAGNOSIS — D62 Acute posthemorrhagic anemia: Secondary | ICD-10-CM | POA: Diagnosis not present

## 2017-03-15 DIAGNOSIS — K922 Gastrointestinal hemorrhage, unspecified: Secondary | ICD-10-CM | POA: Diagnosis not present

## 2017-03-15 DIAGNOSIS — J449 Chronic obstructive pulmonary disease, unspecified: Secondary | ICD-10-CM | POA: Diagnosis not present

## 2017-03-21 DIAGNOSIS — D509 Iron deficiency anemia, unspecified: Secondary | ICD-10-CM | POA: Diagnosis not present

## 2017-03-21 DIAGNOSIS — E784 Other hyperlipidemia: Secondary | ICD-10-CM | POA: Diagnosis not present

## 2017-03-21 DIAGNOSIS — I10 Essential (primary) hypertension: Secondary | ICD-10-CM | POA: Diagnosis not present

## 2017-03-21 DIAGNOSIS — R0609 Other forms of dyspnea: Secondary | ICD-10-CM | POA: Diagnosis not present

## 2017-03-22 DIAGNOSIS — Z6833 Body mass index (BMI) 33.0-33.9, adult: Secondary | ICD-10-CM | POA: Diagnosis not present

## 2017-03-22 DIAGNOSIS — N8500 Endometrial hyperplasia, unspecified: Secondary | ICD-10-CM | POA: Diagnosis not present

## 2017-03-22 DIAGNOSIS — D509 Iron deficiency anemia, unspecified: Secondary | ICD-10-CM | POA: Diagnosis not present

## 2017-04-06 DIAGNOSIS — J411 Mucopurulent chronic bronchitis: Secondary | ICD-10-CM | POA: Diagnosis not present

## 2017-04-14 DIAGNOSIS — J449 Chronic obstructive pulmonary disease, unspecified: Secondary | ICD-10-CM | POA: Diagnosis not present

## 2017-04-18 DIAGNOSIS — D509 Iron deficiency anemia, unspecified: Secondary | ICD-10-CM | POA: Diagnosis not present

## 2017-04-18 DIAGNOSIS — Z6831 Body mass index (BMI) 31.0-31.9, adult: Secondary | ICD-10-CM | POA: Diagnosis not present

## 2017-04-18 DIAGNOSIS — J441 Chronic obstructive pulmonary disease with (acute) exacerbation: Secondary | ICD-10-CM | POA: Diagnosis not present

## 2017-04-23 DIAGNOSIS — D500 Iron deficiency anemia secondary to blood loss (chronic): Secondary | ICD-10-CM | POA: Diagnosis not present

## 2017-04-23 DIAGNOSIS — J441 Chronic obstructive pulmonary disease with (acute) exacerbation: Secondary | ICD-10-CM | POA: Diagnosis not present

## 2017-04-23 DIAGNOSIS — R32 Unspecified urinary incontinence: Secondary | ICD-10-CM | POA: Diagnosis not present

## 2017-04-24 DIAGNOSIS — R32 Unspecified urinary incontinence: Secondary | ICD-10-CM | POA: Diagnosis not present

## 2017-05-05 DIAGNOSIS — F0390 Unspecified dementia without behavioral disturbance: Secondary | ICD-10-CM | POA: Diagnosis not present

## 2017-05-05 DIAGNOSIS — K2970 Gastritis, unspecified, without bleeding: Secondary | ICD-10-CM | POA: Diagnosis not present

## 2017-05-05 DIAGNOSIS — K921 Melena: Secondary | ICD-10-CM | POA: Diagnosis not present

## 2017-05-05 DIAGNOSIS — D509 Iron deficiency anemia, unspecified: Secondary | ICD-10-CM | POA: Diagnosis not present

## 2017-05-05 DIAGNOSIS — K449 Diaphragmatic hernia without obstruction or gangrene: Secondary | ICD-10-CM | POA: Diagnosis not present

## 2017-05-05 DIAGNOSIS — K922 Gastrointestinal hemorrhage, unspecified: Secondary | ICD-10-CM | POA: Diagnosis not present

## 2017-05-05 DIAGNOSIS — D62 Acute posthemorrhagic anemia: Secondary | ICD-10-CM | POA: Diagnosis not present

## 2017-05-05 DIAGNOSIS — Z8719 Personal history of other diseases of the digestive system: Secondary | ICD-10-CM | POA: Diagnosis not present

## 2017-05-05 DIAGNOSIS — R195 Other fecal abnormalities: Secondary | ICD-10-CM | POA: Diagnosis not present

## 2017-05-05 DIAGNOSIS — N179 Acute kidney failure, unspecified: Secondary | ICD-10-CM | POA: Diagnosis not present

## 2017-05-05 DIAGNOSIS — J449 Chronic obstructive pulmonary disease, unspecified: Secondary | ICD-10-CM | POA: Diagnosis not present

## 2017-05-05 DIAGNOSIS — K5730 Diverticulosis of large intestine without perforation or abscess without bleeding: Secondary | ICD-10-CM | POA: Diagnosis not present

## 2017-05-05 DIAGNOSIS — K317 Polyp of stomach and duodenum: Secondary | ICD-10-CM | POA: Diagnosis not present

## 2017-05-05 DIAGNOSIS — D649 Anemia, unspecified: Secondary | ICD-10-CM | POA: Diagnosis not present

## 2017-05-06 DIAGNOSIS — J449 Chronic obstructive pulmonary disease, unspecified: Secondary | ICD-10-CM | POA: Diagnosis not present

## 2017-05-06 DIAGNOSIS — D62 Acute posthemorrhagic anemia: Secondary | ICD-10-CM | POA: Diagnosis not present

## 2017-05-06 DIAGNOSIS — N179 Acute kidney failure, unspecified: Secondary | ICD-10-CM | POA: Diagnosis not present

## 2017-05-06 DIAGNOSIS — D509 Iron deficiency anemia, unspecified: Secondary | ICD-10-CM | POA: Diagnosis not present

## 2017-05-06 DIAGNOSIS — K921 Melena: Secondary | ICD-10-CM | POA: Diagnosis not present

## 2017-05-06 DIAGNOSIS — D649 Anemia, unspecified: Secondary | ICD-10-CM | POA: Diagnosis not present

## 2017-05-06 DIAGNOSIS — J411 Mucopurulent chronic bronchitis: Secondary | ICD-10-CM | POA: Diagnosis not present

## 2017-05-07 DIAGNOSIS — K449 Diaphragmatic hernia without obstruction or gangrene: Secondary | ICD-10-CM | POA: Diagnosis not present

## 2017-05-07 DIAGNOSIS — R935 Abnormal findings on diagnostic imaging of other abdominal regions, including retroperitoneum: Secondary | ICD-10-CM | POA: Diagnosis not present

## 2017-05-07 DIAGNOSIS — K3189 Other diseases of stomach and duodenum: Secondary | ICD-10-CM | POA: Diagnosis not present

## 2017-05-07 DIAGNOSIS — K2900 Acute gastritis without bleeding: Secondary | ICD-10-CM | POA: Diagnosis not present

## 2017-05-07 DIAGNOSIS — D131 Benign neoplasm of stomach: Secondary | ICD-10-CM | POA: Diagnosis not present

## 2017-05-07 DIAGNOSIS — K317 Polyp of stomach and duodenum: Secondary | ICD-10-CM | POA: Diagnosis not present

## 2017-05-07 DIAGNOSIS — K2970 Gastritis, unspecified, without bleeding: Secondary | ICD-10-CM | POA: Diagnosis not present

## 2017-05-07 HISTORY — PX: ESOPHAGOGASTRODUODENOSCOPY: SHX1529

## 2017-05-08 DIAGNOSIS — D509 Iron deficiency anemia, unspecified: Secondary | ICD-10-CM | POA: Diagnosis not present

## 2017-05-08 DIAGNOSIS — D62 Acute posthemorrhagic anemia: Secondary | ICD-10-CM | POA: Diagnosis not present

## 2017-05-08 DIAGNOSIS — F0390 Unspecified dementia without behavioral disturbance: Secondary | ICD-10-CM | POA: Diagnosis not present

## 2017-05-08 DIAGNOSIS — K5730 Diverticulosis of large intestine without perforation or abscess without bleeding: Secondary | ICD-10-CM | POA: Diagnosis not present

## 2017-05-08 DIAGNOSIS — N179 Acute kidney failure, unspecified: Secondary | ICD-10-CM | POA: Diagnosis not present

## 2017-05-08 DIAGNOSIS — J449 Chronic obstructive pulmonary disease, unspecified: Secondary | ICD-10-CM | POA: Diagnosis not present

## 2017-05-08 DIAGNOSIS — D649 Anemia, unspecified: Secondary | ICD-10-CM | POA: Diagnosis not present

## 2017-05-08 DIAGNOSIS — K921 Melena: Secondary | ICD-10-CM | POA: Diagnosis not present

## 2017-05-09 DIAGNOSIS — D649 Anemia, unspecified: Secondary | ICD-10-CM | POA: Diagnosis not present

## 2017-05-09 DIAGNOSIS — K5730 Diverticulosis of large intestine without perforation or abscess without bleeding: Secondary | ICD-10-CM | POA: Diagnosis not present

## 2017-05-09 DIAGNOSIS — D509 Iron deficiency anemia, unspecified: Secondary | ICD-10-CM | POA: Diagnosis not present

## 2017-05-09 DIAGNOSIS — R195 Other fecal abnormalities: Secondary | ICD-10-CM | POA: Diagnosis not present

## 2017-05-09 HISTORY — PX: COLONOSCOPY: SHX174

## 2017-05-10 DIAGNOSIS — M25512 Pain in left shoulder: Secondary | ICD-10-CM | POA: Diagnosis not present

## 2017-05-10 DIAGNOSIS — D509 Iron deficiency anemia, unspecified: Secondary | ICD-10-CM | POA: Diagnosis not present

## 2017-05-10 DIAGNOSIS — Z789 Other specified health status: Secondary | ICD-10-CM | POA: Diagnosis not present

## 2017-05-10 DIAGNOSIS — W19XXXA Unspecified fall, initial encounter: Secondary | ICD-10-CM | POA: Diagnosis not present

## 2017-05-10 DIAGNOSIS — K625 Hemorrhage of anus and rectum: Secondary | ICD-10-CM | POA: Diagnosis not present

## 2017-05-14 DIAGNOSIS — R938 Abnormal findings on diagnostic imaging of other specified body structures: Secondary | ICD-10-CM | POA: Diagnosis not present

## 2017-05-14 DIAGNOSIS — J449 Chronic obstructive pulmonary disease, unspecified: Secondary | ICD-10-CM | POA: Diagnosis not present

## 2017-05-22 DIAGNOSIS — D509 Iron deficiency anemia, unspecified: Secondary | ICD-10-CM | POA: Diagnosis not present

## 2017-05-24 DIAGNOSIS — D509 Iron deficiency anemia, unspecified: Secondary | ICD-10-CM | POA: Diagnosis not present

## 2017-05-25 DIAGNOSIS — K922 Gastrointestinal hemorrhage, unspecified: Secondary | ICD-10-CM | POA: Diagnosis not present

## 2017-05-25 DIAGNOSIS — Z789 Other specified health status: Secondary | ICD-10-CM | POA: Diagnosis not present

## 2017-05-25 DIAGNOSIS — Z7189 Other specified counseling: Secondary | ICD-10-CM | POA: Diagnosis not present

## 2017-05-30 DIAGNOSIS — R938 Abnormal findings on diagnostic imaging of other specified body structures: Secondary | ICD-10-CM | POA: Diagnosis not present

## 2017-05-31 DIAGNOSIS — D509 Iron deficiency anemia, unspecified: Secondary | ICD-10-CM | POA: Diagnosis not present

## 2017-06-04 DIAGNOSIS — M25512 Pain in left shoulder: Secondary | ICD-10-CM | POA: Diagnosis not present

## 2017-06-04 DIAGNOSIS — I129 Hypertensive chronic kidney disease with stage 1 through stage 4 chronic kidney disease, or unspecified chronic kidney disease: Secondary | ICD-10-CM | POA: Diagnosis not present

## 2017-06-04 DIAGNOSIS — E785 Hyperlipidemia, unspecified: Secondary | ICD-10-CM | POA: Diagnosis not present

## 2017-06-05 DIAGNOSIS — R938 Abnormal findings on diagnostic imaging of other specified body structures: Secondary | ICD-10-CM | POA: Diagnosis not present

## 2017-06-05 DIAGNOSIS — Z139 Encounter for screening, unspecified: Secondary | ICD-10-CM | POA: Diagnosis not present

## 2017-06-05 DIAGNOSIS — Z1389 Encounter for screening for other disorder: Secondary | ICD-10-CM | POA: Diagnosis not present

## 2017-06-05 DIAGNOSIS — I129 Hypertensive chronic kidney disease with stage 1 through stage 4 chronic kidney disease, or unspecified chronic kidney disease: Secondary | ICD-10-CM | POA: Diagnosis not present

## 2017-06-05 DIAGNOSIS — E785 Hyperlipidemia, unspecified: Secondary | ICD-10-CM | POA: Diagnosis not present

## 2017-06-05 DIAGNOSIS — N182 Chronic kidney disease, stage 2 (mild): Secondary | ICD-10-CM | POA: Diagnosis not present

## 2017-06-06 DIAGNOSIS — R0609 Other forms of dyspnea: Secondary | ICD-10-CM | POA: Diagnosis not present

## 2017-06-06 DIAGNOSIS — R5383 Other fatigue: Secondary | ICD-10-CM | POA: Diagnosis not present

## 2017-06-06 DIAGNOSIS — R931 Abnormal findings on diagnostic imaging of heart and coronary circulation: Secondary | ICD-10-CM | POA: Diagnosis not present

## 2017-06-06 DIAGNOSIS — J411 Mucopurulent chronic bronchitis: Secondary | ICD-10-CM | POA: Diagnosis not present

## 2017-06-06 DIAGNOSIS — R9431 Abnormal electrocardiogram [ECG] [EKG]: Secondary | ICD-10-CM | POA: Diagnosis not present

## 2017-06-07 DIAGNOSIS — D509 Iron deficiency anemia, unspecified: Secondary | ICD-10-CM | POA: Diagnosis not present

## 2017-06-11 DIAGNOSIS — E785 Hyperlipidemia, unspecified: Secondary | ICD-10-CM | POA: Diagnosis not present

## 2017-06-11 DIAGNOSIS — I129 Hypertensive chronic kidney disease with stage 1 through stage 4 chronic kidney disease, or unspecified chronic kidney disease: Secondary | ICD-10-CM | POA: Diagnosis not present

## 2017-06-11 DIAGNOSIS — J305 Allergic rhinitis due to food: Secondary | ICD-10-CM | POA: Diagnosis not present

## 2017-06-11 DIAGNOSIS — G8929 Other chronic pain: Secondary | ICD-10-CM | POA: Diagnosis not present

## 2017-06-11 DIAGNOSIS — J302 Other seasonal allergic rhinitis: Secondary | ICD-10-CM | POA: Diagnosis not present

## 2017-06-11 DIAGNOSIS — M25512 Pain in left shoulder: Secondary | ICD-10-CM | POA: Diagnosis not present

## 2017-06-11 DIAGNOSIS — N182 Chronic kidney disease, stage 2 (mild): Secondary | ICD-10-CM | POA: Diagnosis not present

## 2017-06-11 DIAGNOSIS — I2510 Atherosclerotic heart disease of native coronary artery without angina pectoris: Secondary | ICD-10-CM | POA: Diagnosis not present

## 2017-06-12 DIAGNOSIS — J3089 Other allergic rhinitis: Secondary | ICD-10-CM | POA: Diagnosis not present

## 2017-06-12 DIAGNOSIS — J302 Other seasonal allergic rhinitis: Secondary | ICD-10-CM | POA: Diagnosis not present

## 2017-06-13 DIAGNOSIS — J302 Other seasonal allergic rhinitis: Secondary | ICD-10-CM | POA: Diagnosis not present

## 2017-06-13 DIAGNOSIS — J3089 Other allergic rhinitis: Secondary | ICD-10-CM | POA: Diagnosis not present

## 2017-06-14 DIAGNOSIS — J302 Other seasonal allergic rhinitis: Secondary | ICD-10-CM | POA: Diagnosis not present

## 2017-06-14 DIAGNOSIS — J3089 Other allergic rhinitis: Secondary | ICD-10-CM | POA: Diagnosis not present

## 2017-06-14 DIAGNOSIS — J449 Chronic obstructive pulmonary disease, unspecified: Secondary | ICD-10-CM | POA: Diagnosis not present

## 2017-06-15 DIAGNOSIS — J302 Other seasonal allergic rhinitis: Secondary | ICD-10-CM | POA: Diagnosis not present

## 2017-06-15 DIAGNOSIS — J3089 Other allergic rhinitis: Secondary | ICD-10-CM | POA: Diagnosis not present

## 2017-06-18 DIAGNOSIS — J302 Other seasonal allergic rhinitis: Secondary | ICD-10-CM | POA: Diagnosis not present

## 2017-06-18 DIAGNOSIS — J3089 Other allergic rhinitis: Secondary | ICD-10-CM | POA: Diagnosis not present

## 2017-06-19 DIAGNOSIS — J302 Other seasonal allergic rhinitis: Secondary | ICD-10-CM | POA: Diagnosis not present

## 2017-06-19 DIAGNOSIS — J3089 Other allergic rhinitis: Secondary | ICD-10-CM | POA: Diagnosis not present

## 2017-06-20 ENCOUNTER — Encounter: Payer: Self-pay | Admitting: Family Medicine

## 2017-06-20 DIAGNOSIS — J302 Other seasonal allergic rhinitis: Secondary | ICD-10-CM | POA: Diagnosis not present

## 2017-06-20 DIAGNOSIS — J3089 Other allergic rhinitis: Secondary | ICD-10-CM | POA: Diagnosis not present

## 2017-06-21 DIAGNOSIS — I2510 Atherosclerotic heart disease of native coronary artery without angina pectoris: Secondary | ICD-10-CM | POA: Diagnosis not present

## 2017-06-21 DIAGNOSIS — J3089 Other allergic rhinitis: Secondary | ICD-10-CM | POA: Diagnosis not present

## 2017-06-21 DIAGNOSIS — J302 Other seasonal allergic rhinitis: Secondary | ICD-10-CM | POA: Diagnosis not present

## 2017-06-22 DIAGNOSIS — R32 Unspecified urinary incontinence: Secondary | ICD-10-CM | POA: Diagnosis not present

## 2017-06-22 DIAGNOSIS — J302 Other seasonal allergic rhinitis: Secondary | ICD-10-CM | POA: Diagnosis not present

## 2017-06-22 DIAGNOSIS — J3089 Other allergic rhinitis: Secondary | ICD-10-CM | POA: Diagnosis not present

## 2017-06-26 DIAGNOSIS — R32 Unspecified urinary incontinence: Secondary | ICD-10-CM | POA: Diagnosis not present

## 2017-06-26 DIAGNOSIS — J449 Chronic obstructive pulmonary disease, unspecified: Secondary | ICD-10-CM | POA: Diagnosis not present

## 2017-06-26 DIAGNOSIS — Z789 Other specified health status: Secondary | ICD-10-CM | POA: Diagnosis not present

## 2017-06-26 DIAGNOSIS — I10 Essential (primary) hypertension: Secondary | ICD-10-CM | POA: Diagnosis not present

## 2017-06-28 DIAGNOSIS — R938 Abnormal findings on diagnostic imaging of other specified body structures: Secondary | ICD-10-CM | POA: Diagnosis not present

## 2017-06-28 DIAGNOSIS — Z87891 Personal history of nicotine dependence: Secondary | ICD-10-CM | POA: Diagnosis not present

## 2017-06-28 DIAGNOSIS — I2510 Atherosclerotic heart disease of native coronary artery without angina pectoris: Secondary | ICD-10-CM | POA: Diagnosis not present

## 2017-06-28 DIAGNOSIS — Z955 Presence of coronary angioplasty implant and graft: Secondary | ICD-10-CM | POA: Diagnosis not present

## 2017-06-28 DIAGNOSIS — N804 Endometriosis of rectovaginal septum and vagina: Secondary | ICD-10-CM | POA: Diagnosis not present

## 2017-06-28 DIAGNOSIS — I252 Old myocardial infarction: Secondary | ICD-10-CM | POA: Diagnosis not present

## 2017-06-28 DIAGNOSIS — I10 Essential (primary) hypertension: Secondary | ICD-10-CM | POA: Diagnosis not present

## 2017-06-28 DIAGNOSIS — N849 Polyp of female genital tract, unspecified: Secondary | ICD-10-CM | POA: Diagnosis not present

## 2017-06-28 DIAGNOSIS — N840 Polyp of corpus uteri: Secondary | ICD-10-CM | POA: Diagnosis not present

## 2017-06-28 DIAGNOSIS — J449 Chronic obstructive pulmonary disease, unspecified: Secondary | ICD-10-CM | POA: Diagnosis not present

## 2017-06-28 DIAGNOSIS — K219 Gastro-esophageal reflux disease without esophagitis: Secondary | ICD-10-CM | POA: Diagnosis not present

## 2017-07-06 DIAGNOSIS — J411 Mucopurulent chronic bronchitis: Secondary | ICD-10-CM | POA: Diagnosis not present

## 2017-07-14 DIAGNOSIS — J449 Chronic obstructive pulmonary disease, unspecified: Secondary | ICD-10-CM | POA: Diagnosis not present

## 2017-07-30 DIAGNOSIS — Z6832 Body mass index (BMI) 32.0-32.9, adult: Secondary | ICD-10-CM | POA: Diagnosis not present

## 2017-07-30 DIAGNOSIS — D509 Iron deficiency anemia, unspecified: Secondary | ICD-10-CM | POA: Diagnosis not present

## 2017-08-06 DIAGNOSIS — J411 Mucopurulent chronic bronchitis: Secondary | ICD-10-CM | POA: Diagnosis not present

## 2017-08-13 DIAGNOSIS — Z789 Other specified health status: Secondary | ICD-10-CM | POA: Diagnosis not present

## 2017-08-13 DIAGNOSIS — J9610 Chronic respiratory failure, unspecified whether with hypoxia or hypercapnia: Secondary | ICD-10-CM | POA: Diagnosis not present

## 2017-08-13 DIAGNOSIS — D509 Iron deficiency anemia, unspecified: Secondary | ICD-10-CM | POA: Diagnosis not present

## 2017-08-14 DIAGNOSIS — J449 Chronic obstructive pulmonary disease, unspecified: Secondary | ICD-10-CM | POA: Diagnosis not present

## 2017-08-20 DIAGNOSIS — R32 Unspecified urinary incontinence: Secondary | ICD-10-CM | POA: Diagnosis not present

## 2017-08-28 DIAGNOSIS — D509 Iron deficiency anemia, unspecified: Secondary | ICD-10-CM | POA: Diagnosis not present

## 2017-08-28 DIAGNOSIS — Z23 Encounter for immunization: Secondary | ICD-10-CM | POA: Diagnosis not present

## 2017-08-28 DIAGNOSIS — D519 Vitamin B12 deficiency anemia, unspecified: Secondary | ICD-10-CM | POA: Diagnosis not present

## 2017-08-29 DIAGNOSIS — J3089 Other allergic rhinitis: Secondary | ICD-10-CM | POA: Diagnosis not present

## 2017-09-04 DIAGNOSIS — J9601 Acute respiratory failure with hypoxia: Secondary | ICD-10-CM | POA: Diagnosis not present

## 2017-09-04 DIAGNOSIS — R32 Unspecified urinary incontinence: Secondary | ICD-10-CM | POA: Diagnosis not present

## 2017-09-04 DIAGNOSIS — J449 Chronic obstructive pulmonary disease, unspecified: Secondary | ICD-10-CM | POA: Diagnosis not present

## 2017-09-04 DIAGNOSIS — Z9981 Dependence on supplemental oxygen: Secondary | ICD-10-CM | POA: Diagnosis not present

## 2017-09-04 DIAGNOSIS — J411 Mucopurulent chronic bronchitis: Secondary | ICD-10-CM | POA: Diagnosis not present

## 2017-09-06 DIAGNOSIS — J411 Mucopurulent chronic bronchitis: Secondary | ICD-10-CM | POA: Diagnosis not present

## 2017-09-12 DIAGNOSIS — J449 Chronic obstructive pulmonary disease, unspecified: Secondary | ICD-10-CM | POA: Diagnosis not present

## 2017-09-12 DIAGNOSIS — J3089 Other allergic rhinitis: Secondary | ICD-10-CM | POA: Diagnosis not present

## 2017-09-12 DIAGNOSIS — Z789 Other specified health status: Secondary | ICD-10-CM | POA: Diagnosis not present

## 2017-09-21 DIAGNOSIS — J3089 Other allergic rhinitis: Secondary | ICD-10-CM | POA: Diagnosis not present

## 2017-09-26 DIAGNOSIS — J3089 Other allergic rhinitis: Secondary | ICD-10-CM | POA: Diagnosis not present

## 2017-09-26 DIAGNOSIS — D519 Vitamin B12 deficiency anemia, unspecified: Secondary | ICD-10-CM | POA: Diagnosis not present

## 2017-10-04 DIAGNOSIS — J411 Mucopurulent chronic bronchitis: Secondary | ICD-10-CM | POA: Diagnosis not present

## 2017-10-04 DIAGNOSIS — Z9981 Dependence on supplemental oxygen: Secondary | ICD-10-CM | POA: Diagnosis not present

## 2017-10-04 DIAGNOSIS — J449 Chronic obstructive pulmonary disease, unspecified: Secondary | ICD-10-CM | POA: Diagnosis not present

## 2017-10-04 DIAGNOSIS — J9601 Acute respiratory failure with hypoxia: Secondary | ICD-10-CM | POA: Diagnosis not present

## 2017-10-06 DIAGNOSIS — J411 Mucopurulent chronic bronchitis: Secondary | ICD-10-CM | POA: Diagnosis not present

## 2017-10-17 DIAGNOSIS — J3089 Other allergic rhinitis: Secondary | ICD-10-CM | POA: Diagnosis not present

## 2017-10-22 DIAGNOSIS — R32 Unspecified urinary incontinence: Secondary | ICD-10-CM | POA: Diagnosis not present

## 2017-10-25 DIAGNOSIS — R32 Unspecified urinary incontinence: Secondary | ICD-10-CM | POA: Diagnosis not present

## 2017-10-26 DIAGNOSIS — J3089 Other allergic rhinitis: Secondary | ICD-10-CM | POA: Diagnosis not present

## 2017-11-04 DIAGNOSIS — J9601 Acute respiratory failure with hypoxia: Secondary | ICD-10-CM | POA: Diagnosis not present

## 2017-11-04 DIAGNOSIS — J449 Chronic obstructive pulmonary disease, unspecified: Secondary | ICD-10-CM | POA: Diagnosis not present

## 2017-11-04 DIAGNOSIS — J411 Mucopurulent chronic bronchitis: Secondary | ICD-10-CM | POA: Diagnosis not present

## 2017-11-04 DIAGNOSIS — Z9981 Dependence on supplemental oxygen: Secondary | ICD-10-CM | POA: Diagnosis not present

## 2017-11-06 DIAGNOSIS — J3089 Other allergic rhinitis: Secondary | ICD-10-CM | POA: Diagnosis not present

## 2017-11-06 DIAGNOSIS — J411 Mucopurulent chronic bronchitis: Secondary | ICD-10-CM | POA: Diagnosis not present

## 2017-11-12 DIAGNOSIS — J3089 Other allergic rhinitis: Secondary | ICD-10-CM | POA: Diagnosis not present

## 2017-11-21 DIAGNOSIS — J3089 Other allergic rhinitis: Secondary | ICD-10-CM | POA: Diagnosis not present

## 2017-11-26 DIAGNOSIS — I10 Essential (primary) hypertension: Secondary | ICD-10-CM | POA: Diagnosis not present

## 2017-11-26 DIAGNOSIS — J3089 Other allergic rhinitis: Secondary | ICD-10-CM | POA: Diagnosis not present

## 2017-11-26 DIAGNOSIS — E785 Hyperlipidemia, unspecified: Secondary | ICD-10-CM | POA: Diagnosis not present

## 2017-12-04 DIAGNOSIS — Z9981 Dependence on supplemental oxygen: Secondary | ICD-10-CM | POA: Diagnosis not present

## 2017-12-04 DIAGNOSIS — J449 Chronic obstructive pulmonary disease, unspecified: Secondary | ICD-10-CM | POA: Diagnosis not present

## 2017-12-04 DIAGNOSIS — J411 Mucopurulent chronic bronchitis: Secondary | ICD-10-CM | POA: Diagnosis not present

## 2017-12-04 DIAGNOSIS — J9601 Acute respiratory failure with hypoxia: Secondary | ICD-10-CM | POA: Diagnosis not present

## 2017-12-06 DIAGNOSIS — J411 Mucopurulent chronic bronchitis: Secondary | ICD-10-CM | POA: Diagnosis not present

## 2017-12-20 DIAGNOSIS — R32 Unspecified urinary incontinence: Secondary | ICD-10-CM | POA: Diagnosis not present

## 2017-12-26 DIAGNOSIS — Z789 Other specified health status: Secondary | ICD-10-CM | POA: Diagnosis not present

## 2017-12-26 DIAGNOSIS — J449 Chronic obstructive pulmonary disease, unspecified: Secondary | ICD-10-CM | POA: Diagnosis not present

## 2017-12-28 DIAGNOSIS — D509 Iron deficiency anemia, unspecified: Secondary | ICD-10-CM | POA: Diagnosis not present

## 2018-01-01 DIAGNOSIS — R262 Difficulty in walking, not elsewhere classified: Secondary | ICD-10-CM | POA: Diagnosis not present

## 2018-01-01 DIAGNOSIS — W010XXD Fall on same level from slipping, tripping and stumbling without subsequent striking against object, subsequent encounter: Secondary | ICD-10-CM | POA: Diagnosis not present

## 2018-01-01 DIAGNOSIS — M6281 Muscle weakness (generalized): Secondary | ICD-10-CM | POA: Diagnosis not present

## 2018-01-04 DIAGNOSIS — J449 Chronic obstructive pulmonary disease, unspecified: Secondary | ICD-10-CM | POA: Diagnosis not present

## 2018-01-04 DIAGNOSIS — J9601 Acute respiratory failure with hypoxia: Secondary | ICD-10-CM | POA: Diagnosis not present

## 2018-01-04 DIAGNOSIS — Z780 Asymptomatic menopausal state: Secondary | ICD-10-CM | POA: Diagnosis not present

## 2018-01-04 DIAGNOSIS — D509 Iron deficiency anemia, unspecified: Secondary | ICD-10-CM | POA: Diagnosis not present

## 2018-01-04 DIAGNOSIS — J411 Mucopurulent chronic bronchitis: Secondary | ICD-10-CM | POA: Diagnosis not present

## 2018-01-04 DIAGNOSIS — M810 Age-related osteoporosis without current pathological fracture: Secondary | ICD-10-CM | POA: Diagnosis not present

## 2018-01-04 DIAGNOSIS — Z9981 Dependence on supplemental oxygen: Secondary | ICD-10-CM | POA: Diagnosis not present

## 2018-01-04 DIAGNOSIS — Z1231 Encounter for screening mammogram for malignant neoplasm of breast: Secondary | ICD-10-CM | POA: Diagnosis not present

## 2018-01-06 DIAGNOSIS — J411 Mucopurulent chronic bronchitis: Secondary | ICD-10-CM | POA: Diagnosis not present

## 2018-01-07 DIAGNOSIS — R05 Cough: Secondary | ICD-10-CM | POA: Diagnosis not present

## 2018-01-07 DIAGNOSIS — Z6831 Body mass index (BMI) 31.0-31.9, adult: Secondary | ICD-10-CM | POA: Diagnosis not present

## 2018-01-07 DIAGNOSIS — J321 Chronic frontal sinusitis: Secondary | ICD-10-CM | POA: Diagnosis not present

## 2018-01-24 DIAGNOSIS — D509 Iron deficiency anemia, unspecified: Secondary | ICD-10-CM | POA: Diagnosis not present

## 2018-01-24 DIAGNOSIS — J449 Chronic obstructive pulmonary disease, unspecified: Secondary | ICD-10-CM | POA: Diagnosis not present

## 2018-01-24 DIAGNOSIS — J9610 Chronic respiratory failure, unspecified whether with hypoxia or hypercapnia: Secondary | ICD-10-CM | POA: Diagnosis not present

## 2018-01-24 DIAGNOSIS — M810 Age-related osteoporosis without current pathological fracture: Secondary | ICD-10-CM | POA: Diagnosis not present

## 2018-01-24 DIAGNOSIS — I5020 Unspecified systolic (congestive) heart failure: Secondary | ICD-10-CM | POA: Diagnosis not present

## 2018-01-31 DIAGNOSIS — D509 Iron deficiency anemia, unspecified: Secondary | ICD-10-CM | POA: Diagnosis not present

## 2018-02-04 DIAGNOSIS — J9601 Acute respiratory failure with hypoxia: Secondary | ICD-10-CM | POA: Diagnosis not present

## 2018-02-04 DIAGNOSIS — J411 Mucopurulent chronic bronchitis: Secondary | ICD-10-CM | POA: Diagnosis not present

## 2018-02-04 DIAGNOSIS — Z9981 Dependence on supplemental oxygen: Secondary | ICD-10-CM | POA: Diagnosis not present

## 2018-02-04 DIAGNOSIS — J449 Chronic obstructive pulmonary disease, unspecified: Secondary | ICD-10-CM | POA: Diagnosis not present

## 2018-02-06 DIAGNOSIS — J411 Mucopurulent chronic bronchitis: Secondary | ICD-10-CM | POA: Diagnosis not present

## 2018-02-19 DIAGNOSIS — R32 Unspecified urinary incontinence: Secondary | ICD-10-CM | POA: Diagnosis not present

## 2018-03-04 DIAGNOSIS — Z9981 Dependence on supplemental oxygen: Secondary | ICD-10-CM | POA: Diagnosis not present

## 2018-03-04 DIAGNOSIS — J449 Chronic obstructive pulmonary disease, unspecified: Secondary | ICD-10-CM | POA: Diagnosis not present

## 2018-03-04 DIAGNOSIS — E785 Hyperlipidemia, unspecified: Secondary | ICD-10-CM | POA: Diagnosis not present

## 2018-03-04 DIAGNOSIS — J411 Mucopurulent chronic bronchitis: Secondary | ICD-10-CM | POA: Diagnosis not present

## 2018-03-04 DIAGNOSIS — I10 Essential (primary) hypertension: Secondary | ICD-10-CM | POA: Diagnosis not present

## 2018-03-04 DIAGNOSIS — J9601 Acute respiratory failure with hypoxia: Secondary | ICD-10-CM | POA: Diagnosis not present

## 2018-03-06 DIAGNOSIS — J411 Mucopurulent chronic bronchitis: Secondary | ICD-10-CM | POA: Diagnosis not present

## 2018-03-11 DIAGNOSIS — J441 Chronic obstructive pulmonary disease with (acute) exacerbation: Secondary | ICD-10-CM | POA: Diagnosis not present

## 2018-03-11 DIAGNOSIS — J0110 Acute frontal sinusitis, unspecified: Secondary | ICD-10-CM | POA: Diagnosis not present

## 2018-03-11 DIAGNOSIS — Z6832 Body mass index (BMI) 32.0-32.9, adult: Secondary | ICD-10-CM | POA: Diagnosis not present

## 2018-03-11 DIAGNOSIS — M818 Other osteoporosis without current pathological fracture: Secondary | ICD-10-CM | POA: Diagnosis not present

## 2018-03-14 DIAGNOSIS — Z789 Other specified health status: Secondary | ICD-10-CM | POA: Diagnosis not present

## 2018-03-14 DIAGNOSIS — J449 Chronic obstructive pulmonary disease, unspecified: Secondary | ICD-10-CM | POA: Diagnosis not present

## 2018-03-14 DIAGNOSIS — Z9981 Dependence on supplemental oxygen: Secondary | ICD-10-CM | POA: Diagnosis not present

## 2018-03-14 DIAGNOSIS — J9610 Chronic respiratory failure, unspecified whether with hypoxia or hypercapnia: Secondary | ICD-10-CM | POA: Diagnosis not present

## 2018-03-25 DIAGNOSIS — J441 Chronic obstructive pulmonary disease with (acute) exacerbation: Secondary | ICD-10-CM | POA: Diagnosis not present

## 2018-03-25 DIAGNOSIS — J9610 Chronic respiratory failure, unspecified whether with hypoxia or hypercapnia: Secondary | ICD-10-CM | POA: Diagnosis not present

## 2018-03-25 DIAGNOSIS — Z6832 Body mass index (BMI) 32.0-32.9, adult: Secondary | ICD-10-CM | POA: Diagnosis not present

## 2018-03-25 DIAGNOSIS — N183 Chronic kidney disease, stage 3 (moderate): Secondary | ICD-10-CM | POA: Diagnosis not present

## 2018-04-04 DIAGNOSIS — J411 Mucopurulent chronic bronchitis: Secondary | ICD-10-CM | POA: Diagnosis not present

## 2018-04-04 DIAGNOSIS — J9601 Acute respiratory failure with hypoxia: Secondary | ICD-10-CM | POA: Diagnosis not present

## 2018-04-04 DIAGNOSIS — Z9981 Dependence on supplemental oxygen: Secondary | ICD-10-CM | POA: Diagnosis not present

## 2018-04-04 DIAGNOSIS — J449 Chronic obstructive pulmonary disease, unspecified: Secondary | ICD-10-CM | POA: Diagnosis not present

## 2018-04-06 DIAGNOSIS — J411 Mucopurulent chronic bronchitis: Secondary | ICD-10-CM | POA: Diagnosis not present

## 2018-04-08 DIAGNOSIS — Z9889 Other specified postprocedural states: Secondary | ICD-10-CM | POA: Diagnosis not present

## 2018-04-08 DIAGNOSIS — J321 Chronic frontal sinusitis: Secondary | ICD-10-CM | POA: Diagnosis not present

## 2018-04-08 DIAGNOSIS — Z6832 Body mass index (BMI) 32.0-32.9, adult: Secondary | ICD-10-CM | POA: Diagnosis not present

## 2018-04-08 DIAGNOSIS — J320 Chronic maxillary sinusitis: Secondary | ICD-10-CM | POA: Diagnosis not present

## 2018-04-18 DIAGNOSIS — R32 Unspecified urinary incontinence: Secondary | ICD-10-CM | POA: Diagnosis not present

## 2018-04-23 DIAGNOSIS — J9610 Chronic respiratory failure, unspecified whether with hypoxia or hypercapnia: Secondary | ICD-10-CM | POA: Diagnosis not present

## 2018-04-23 DIAGNOSIS — J441 Chronic obstructive pulmonary disease with (acute) exacerbation: Secondary | ICD-10-CM | POA: Diagnosis not present

## 2018-04-23 DIAGNOSIS — N183 Chronic kidney disease, stage 3 (moderate): Secondary | ICD-10-CM | POA: Diagnosis not present

## 2018-05-02 DIAGNOSIS — D509 Iron deficiency anemia, unspecified: Secondary | ICD-10-CM | POA: Diagnosis not present

## 2018-05-02 DIAGNOSIS — L218 Other seborrheic dermatitis: Secondary | ICD-10-CM | POA: Diagnosis not present

## 2018-05-02 DIAGNOSIS — Z6832 Body mass index (BMI) 32.0-32.9, adult: Secondary | ICD-10-CM | POA: Diagnosis not present

## 2018-05-02 DIAGNOSIS — D519 Vitamin B12 deficiency anemia, unspecified: Secondary | ICD-10-CM | POA: Diagnosis not present

## 2018-05-04 DIAGNOSIS — J411 Mucopurulent chronic bronchitis: Secondary | ICD-10-CM | POA: Diagnosis not present

## 2018-05-04 DIAGNOSIS — J449 Chronic obstructive pulmonary disease, unspecified: Secondary | ICD-10-CM | POA: Diagnosis not present

## 2018-05-04 DIAGNOSIS — J9601 Acute respiratory failure with hypoxia: Secondary | ICD-10-CM | POA: Diagnosis not present

## 2018-05-04 DIAGNOSIS — Z9981 Dependence on supplemental oxygen: Secondary | ICD-10-CM | POA: Diagnosis not present

## 2018-05-06 DIAGNOSIS — J411 Mucopurulent chronic bronchitis: Secondary | ICD-10-CM | POA: Diagnosis not present

## 2018-05-10 DIAGNOSIS — D509 Iron deficiency anemia, unspecified: Secondary | ICD-10-CM | POA: Diagnosis not present

## 2018-05-14 DIAGNOSIS — M25512 Pain in left shoulder: Secondary | ICD-10-CM | POA: Diagnosis not present

## 2018-05-17 DIAGNOSIS — M7502 Adhesive capsulitis of left shoulder: Secondary | ICD-10-CM | POA: Diagnosis not present

## 2018-05-17 DIAGNOSIS — D509 Iron deficiency anemia, unspecified: Secondary | ICD-10-CM | POA: Diagnosis not present

## 2018-05-17 DIAGNOSIS — Z6832 Body mass index (BMI) 32.0-32.9, adult: Secondary | ICD-10-CM | POA: Diagnosis not present

## 2018-06-04 DIAGNOSIS — J9601 Acute respiratory failure with hypoxia: Secondary | ICD-10-CM | POA: Diagnosis not present

## 2018-06-04 DIAGNOSIS — J411 Mucopurulent chronic bronchitis: Secondary | ICD-10-CM | POA: Diagnosis not present

## 2018-06-04 DIAGNOSIS — J449 Chronic obstructive pulmonary disease, unspecified: Secondary | ICD-10-CM | POA: Diagnosis not present

## 2018-06-04 DIAGNOSIS — Z9981 Dependence on supplemental oxygen: Secondary | ICD-10-CM | POA: Diagnosis not present

## 2018-06-06 DIAGNOSIS — J411 Mucopurulent chronic bronchitis: Secondary | ICD-10-CM | POA: Diagnosis not present

## 2018-06-20 DIAGNOSIS — M19012 Primary osteoarthritis, left shoulder: Secondary | ICD-10-CM | POA: Diagnosis not present

## 2018-07-04 DIAGNOSIS — J9601 Acute respiratory failure with hypoxia: Secondary | ICD-10-CM | POA: Diagnosis not present

## 2018-07-04 DIAGNOSIS — J449 Chronic obstructive pulmonary disease, unspecified: Secondary | ICD-10-CM | POA: Diagnosis not present

## 2018-07-04 DIAGNOSIS — J411 Mucopurulent chronic bronchitis: Secondary | ICD-10-CM | POA: Diagnosis not present

## 2018-07-04 DIAGNOSIS — Z9981 Dependence on supplemental oxygen: Secondary | ICD-10-CM | POA: Diagnosis not present

## 2018-07-06 DIAGNOSIS — J411 Mucopurulent chronic bronchitis: Secondary | ICD-10-CM | POA: Diagnosis not present

## 2018-07-08 DIAGNOSIS — R159 Full incontinence of feces: Secondary | ICD-10-CM | POA: Diagnosis not present

## 2018-07-08 DIAGNOSIS — G3183 Dementia with Lewy bodies: Secondary | ICD-10-CM | POA: Diagnosis not present

## 2018-07-08 DIAGNOSIS — N39490 Overflow incontinence: Secondary | ICD-10-CM | POA: Diagnosis not present

## 2018-07-08 DIAGNOSIS — R3981 Functional urinary incontinence: Secondary | ICD-10-CM | POA: Diagnosis not present

## 2018-07-24 DIAGNOSIS — J9610 Chronic respiratory failure, unspecified whether with hypoxia or hypercapnia: Secondary | ICD-10-CM | POA: Diagnosis not present

## 2018-07-24 DIAGNOSIS — D519 Vitamin B12 deficiency anemia, unspecified: Secondary | ICD-10-CM | POA: Diagnosis not present

## 2018-07-24 DIAGNOSIS — N183 Chronic kidney disease, stage 3 (moderate): Secondary | ICD-10-CM | POA: Diagnosis not present

## 2018-07-24 DIAGNOSIS — J441 Chronic obstructive pulmonary disease with (acute) exacerbation: Secondary | ICD-10-CM | POA: Diagnosis not present

## 2018-08-02 DIAGNOSIS — D509 Iron deficiency anemia, unspecified: Secondary | ICD-10-CM | POA: Diagnosis not present

## 2018-08-04 DIAGNOSIS — Z9981 Dependence on supplemental oxygen: Secondary | ICD-10-CM | POA: Diagnosis not present

## 2018-08-04 DIAGNOSIS — J449 Chronic obstructive pulmonary disease, unspecified: Secondary | ICD-10-CM | POA: Diagnosis not present

## 2018-08-04 DIAGNOSIS — J411 Mucopurulent chronic bronchitis: Secondary | ICD-10-CM | POA: Diagnosis not present

## 2018-08-04 DIAGNOSIS — J9601 Acute respiratory failure with hypoxia: Secondary | ICD-10-CM | POA: Diagnosis not present

## 2018-08-06 DIAGNOSIS — J411 Mucopurulent chronic bronchitis: Secondary | ICD-10-CM | POA: Diagnosis not present

## 2018-08-09 DIAGNOSIS — I5020 Unspecified systolic (congestive) heart failure: Secondary | ICD-10-CM | POA: Diagnosis not present

## 2018-08-09 DIAGNOSIS — Z6834 Body mass index (BMI) 34.0-34.9, adult: Secondary | ICD-10-CM | POA: Diagnosis not present

## 2018-08-09 DIAGNOSIS — J449 Chronic obstructive pulmonary disease, unspecified: Secondary | ICD-10-CM | POA: Diagnosis not present

## 2018-08-12 DIAGNOSIS — M25511 Pain in right shoulder: Secondary | ICD-10-CM | POA: Diagnosis not present

## 2018-08-12 DIAGNOSIS — M19011 Primary osteoarthritis, right shoulder: Secondary | ICD-10-CM | POA: Diagnosis not present

## 2018-08-14 DIAGNOSIS — J449 Chronic obstructive pulmonary disease, unspecified: Secondary | ICD-10-CM | POA: Diagnosis not present

## 2018-08-15 DIAGNOSIS — D509 Iron deficiency anemia, unspecified: Secondary | ICD-10-CM | POA: Diagnosis not present

## 2018-08-22 DIAGNOSIS — D509 Iron deficiency anemia, unspecified: Secondary | ICD-10-CM | POA: Diagnosis not present

## 2018-08-23 DIAGNOSIS — I5020 Unspecified systolic (congestive) heart failure: Secondary | ICD-10-CM | POA: Diagnosis not present

## 2018-08-23 DIAGNOSIS — Z6834 Body mass index (BMI) 34.0-34.9, adult: Secondary | ICD-10-CM | POA: Diagnosis not present

## 2018-08-23 DIAGNOSIS — J449 Chronic obstructive pulmonary disease, unspecified: Secondary | ICD-10-CM | POA: Diagnosis not present

## 2018-08-23 DIAGNOSIS — J441 Chronic obstructive pulmonary disease with (acute) exacerbation: Secondary | ICD-10-CM | POA: Diagnosis not present

## 2018-09-02 DIAGNOSIS — J449 Chronic obstructive pulmonary disease, unspecified: Secondary | ICD-10-CM | POA: Diagnosis not present

## 2018-09-03 DIAGNOSIS — J449 Chronic obstructive pulmonary disease, unspecified: Secondary | ICD-10-CM | POA: Diagnosis not present

## 2018-09-04 DIAGNOSIS — J9601 Acute respiratory failure with hypoxia: Secondary | ICD-10-CM | POA: Diagnosis not present

## 2018-09-04 DIAGNOSIS — J411 Mucopurulent chronic bronchitis: Secondary | ICD-10-CM | POA: Diagnosis not present

## 2018-09-04 DIAGNOSIS — J449 Chronic obstructive pulmonary disease, unspecified: Secondary | ICD-10-CM | POA: Diagnosis not present

## 2018-09-04 DIAGNOSIS — Z9981 Dependence on supplemental oxygen: Secondary | ICD-10-CM | POA: Diagnosis not present

## 2018-09-06 DIAGNOSIS — J411 Mucopurulent chronic bronchitis: Secondary | ICD-10-CM | POA: Diagnosis not present

## 2018-09-23 DIAGNOSIS — J449 Chronic obstructive pulmonary disease, unspecified: Secondary | ICD-10-CM | POA: Diagnosis not present

## 2018-09-23 DIAGNOSIS — I5020 Unspecified systolic (congestive) heart failure: Secondary | ICD-10-CM | POA: Diagnosis not present

## 2018-09-23 DIAGNOSIS — J441 Chronic obstructive pulmonary disease with (acute) exacerbation: Secondary | ICD-10-CM | POA: Diagnosis not present

## 2018-09-23 DIAGNOSIS — Z6834 Body mass index (BMI) 34.0-34.9, adult: Secondary | ICD-10-CM | POA: Diagnosis not present

## 2018-09-30 DIAGNOSIS — Z139 Encounter for screening, unspecified: Secondary | ICD-10-CM | POA: Diagnosis not present

## 2018-09-30 DIAGNOSIS — D519 Vitamin B12 deficiency anemia, unspecified: Secondary | ICD-10-CM | POA: Diagnosis not present

## 2018-09-30 DIAGNOSIS — Z0000 Encounter for general adult medical examination without abnormal findings: Secondary | ICD-10-CM | POA: Diagnosis not present

## 2018-09-30 DIAGNOSIS — R350 Frequency of micturition: Secondary | ICD-10-CM | POA: Diagnosis not present

## 2018-09-30 DIAGNOSIS — Z23 Encounter for immunization: Secondary | ICD-10-CM | POA: Diagnosis not present

## 2018-09-30 DIAGNOSIS — Z1331 Encounter for screening for depression: Secondary | ICD-10-CM | POA: Diagnosis not present

## 2018-10-03 DIAGNOSIS — I959 Hypotension, unspecified: Secondary | ICD-10-CM | POA: Diagnosis not present

## 2018-10-03 DIAGNOSIS — R531 Weakness: Secondary | ICD-10-CM | POA: Diagnosis not present

## 2018-10-03 DIAGNOSIS — J449 Chronic obstructive pulmonary disease, unspecified: Secondary | ICD-10-CM | POA: Diagnosis not present

## 2018-10-03 DIAGNOSIS — R58 Hemorrhage, not elsewhere classified: Secondary | ICD-10-CM | POA: Diagnosis not present

## 2018-10-04 DIAGNOSIS — K2970 Gastritis, unspecified, without bleeding: Secondary | ICD-10-CM | POA: Diagnosis not present

## 2018-10-04 DIAGNOSIS — J9601 Acute respiratory failure with hypoxia: Secondary | ICD-10-CM | POA: Diagnosis not present

## 2018-10-04 DIAGNOSIS — I2510 Atherosclerotic heart disease of native coronary artery without angina pectoris: Secondary | ICD-10-CM | POA: Diagnosis not present

## 2018-10-04 DIAGNOSIS — J411 Mucopurulent chronic bronchitis: Secondary | ICD-10-CM | POA: Diagnosis not present

## 2018-10-04 DIAGNOSIS — R1013 Epigastric pain: Secondary | ICD-10-CM | POA: Diagnosis not present

## 2018-10-04 DIAGNOSIS — R0602 Shortness of breath: Secondary | ICD-10-CM | POA: Diagnosis not present

## 2018-10-04 DIAGNOSIS — D131 Benign neoplasm of stomach: Secondary | ICD-10-CM | POA: Diagnosis not present

## 2018-10-04 DIAGNOSIS — D500 Iron deficiency anemia secondary to blood loss (chronic): Secondary | ICD-10-CM | POA: Diagnosis not present

## 2018-10-04 DIAGNOSIS — Z8711 Personal history of peptic ulcer disease: Secondary | ICD-10-CM | POA: Diagnosis not present

## 2018-10-04 DIAGNOSIS — R109 Unspecified abdominal pain: Secondary | ICD-10-CM | POA: Diagnosis not present

## 2018-10-04 DIAGNOSIS — K219 Gastro-esophageal reflux disease without esophagitis: Secondary | ICD-10-CM | POA: Diagnosis not present

## 2018-10-04 DIAGNOSIS — R079 Chest pain, unspecified: Secondary | ICD-10-CM | POA: Diagnosis not present

## 2018-10-04 DIAGNOSIS — K922 Gastrointestinal hemorrhage, unspecified: Secondary | ICD-10-CM | POA: Diagnosis not present

## 2018-10-04 DIAGNOSIS — J449 Chronic obstructive pulmonary disease, unspecified: Secondary | ICD-10-CM | POA: Diagnosis not present

## 2018-10-04 DIAGNOSIS — Z9981 Dependence on supplemental oxygen: Secondary | ICD-10-CM | POA: Diagnosis not present

## 2018-10-04 DIAGNOSIS — D509 Iron deficiency anemia, unspecified: Secondary | ICD-10-CM | POA: Diagnosis not present

## 2018-10-04 DIAGNOSIS — R195 Other fecal abnormalities: Secondary | ICD-10-CM | POA: Diagnosis not present

## 2018-10-04 DIAGNOSIS — D62 Acute posthemorrhagic anemia: Secondary | ICD-10-CM | POA: Diagnosis not present

## 2018-10-04 DIAGNOSIS — Z7902 Long term (current) use of antithrombotics/antiplatelets: Secondary | ICD-10-CM | POA: Diagnosis not present

## 2018-10-04 DIAGNOSIS — I10 Essential (primary) hypertension: Secondary | ICD-10-CM | POA: Diagnosis not present

## 2018-10-04 DIAGNOSIS — D649 Anemia, unspecified: Secondary | ICD-10-CM | POA: Diagnosis not present

## 2018-10-04 DIAGNOSIS — E785 Hyperlipidemia, unspecified: Secondary | ICD-10-CM | POA: Diagnosis not present

## 2018-10-04 DIAGNOSIS — K921 Melena: Secondary | ICD-10-CM | POA: Diagnosis not present

## 2018-10-05 DIAGNOSIS — K219 Gastro-esophageal reflux disease without esophagitis: Secondary | ICD-10-CM | POA: Diagnosis not present

## 2018-10-05 DIAGNOSIS — K2970 Gastritis, unspecified, without bleeding: Secondary | ICD-10-CM | POA: Diagnosis not present

## 2018-10-05 DIAGNOSIS — R195 Other fecal abnormalities: Secondary | ICD-10-CM | POA: Diagnosis not present

## 2018-10-05 DIAGNOSIS — D131 Benign neoplasm of stomach: Secondary | ICD-10-CM | POA: Diagnosis not present

## 2018-10-05 DIAGNOSIS — D649 Anemia, unspecified: Secondary | ICD-10-CM | POA: Diagnosis not present

## 2018-10-05 DIAGNOSIS — Z8711 Personal history of peptic ulcer disease: Secondary | ICD-10-CM | POA: Diagnosis not present

## 2018-10-05 DIAGNOSIS — D500 Iron deficiency anemia secondary to blood loss (chronic): Secondary | ICD-10-CM | POA: Diagnosis not present

## 2018-10-06 DIAGNOSIS — K219 Gastro-esophageal reflux disease without esophagitis: Secondary | ICD-10-CM | POA: Diagnosis not present

## 2018-10-06 DIAGNOSIS — D131 Benign neoplasm of stomach: Secondary | ICD-10-CM | POA: Diagnosis not present

## 2018-10-06 DIAGNOSIS — R195 Other fecal abnormalities: Secondary | ICD-10-CM | POA: Diagnosis not present

## 2018-10-06 DIAGNOSIS — J411 Mucopurulent chronic bronchitis: Secondary | ICD-10-CM | POA: Diagnosis not present

## 2018-10-06 DIAGNOSIS — D649 Anemia, unspecified: Secondary | ICD-10-CM | POA: Diagnosis not present

## 2018-10-06 DIAGNOSIS — Z8711 Personal history of peptic ulcer disease: Secondary | ICD-10-CM | POA: Diagnosis not present

## 2018-10-06 DIAGNOSIS — D500 Iron deficiency anemia secondary to blood loss (chronic): Secondary | ICD-10-CM | POA: Diagnosis not present

## 2018-10-06 DIAGNOSIS — K2970 Gastritis, unspecified, without bleeding: Secondary | ICD-10-CM | POA: Diagnosis not present

## 2018-10-10 DIAGNOSIS — Z6833 Body mass index (BMI) 33.0-33.9, adult: Secondary | ICD-10-CM | POA: Diagnosis not present

## 2018-10-10 DIAGNOSIS — D509 Iron deficiency anemia, unspecified: Secondary | ICD-10-CM | POA: Diagnosis not present

## 2018-10-21 DIAGNOSIS — Z139 Encounter for screening, unspecified: Secondary | ICD-10-CM | POA: Diagnosis not present

## 2018-10-21 DIAGNOSIS — K921 Melena: Secondary | ICD-10-CM | POA: Diagnosis not present

## 2018-10-21 DIAGNOSIS — D509 Iron deficiency anemia, unspecified: Secondary | ICD-10-CM | POA: Diagnosis not present

## 2018-10-21 DIAGNOSIS — Z6833 Body mass index (BMI) 33.0-33.9, adult: Secondary | ICD-10-CM | POA: Diagnosis not present

## 2018-10-23 ENCOUNTER — Encounter: Payer: Self-pay | Admitting: Gastroenterology

## 2018-10-23 DIAGNOSIS — D500 Iron deficiency anemia secondary to blood loss (chronic): Secondary | ICD-10-CM | POA: Diagnosis not present

## 2018-10-24 ENCOUNTER — Ambulatory Visit (INDEPENDENT_AMBULATORY_CARE_PROVIDER_SITE_OTHER): Payer: Medicare HMO | Admitting: Gastroenterology

## 2018-10-24 ENCOUNTER — Encounter: Payer: Self-pay | Admitting: Gastroenterology

## 2018-10-24 ENCOUNTER — Other Ambulatory Visit (INDEPENDENT_AMBULATORY_CARE_PROVIDER_SITE_OTHER): Payer: Medicare HMO

## 2018-10-24 VITALS — BP 140/80 | HR 74 | Ht 61.75 in | Wt 190.0 lb

## 2018-10-24 DIAGNOSIS — R195 Other fecal abnormalities: Secondary | ICD-10-CM

## 2018-10-24 DIAGNOSIS — J441 Chronic obstructive pulmonary disease with (acute) exacerbation: Secondary | ICD-10-CM | POA: Diagnosis not present

## 2018-10-24 DIAGNOSIS — I5020 Unspecified systolic (congestive) heart failure: Secondary | ICD-10-CM | POA: Diagnosis not present

## 2018-10-24 DIAGNOSIS — Z6833 Body mass index (BMI) 33.0-33.9, adult: Secondary | ICD-10-CM | POA: Diagnosis not present

## 2018-10-24 DIAGNOSIS — J449 Chronic obstructive pulmonary disease, unspecified: Secondary | ICD-10-CM | POA: Diagnosis not present

## 2018-10-24 LAB — CBC WITH DIFFERENTIAL/PLATELET
BASOS PCT: 0.7 % (ref 0.0–3.0)
Basophils Absolute: 0 10*3/uL (ref 0.0–0.1)
Eosinophils Absolute: 0.1 10*3/uL (ref 0.0–0.7)
Eosinophils Relative: 1.2 % (ref 0.0–5.0)
HCT: 32.4 % — ABNORMAL LOW (ref 36.0–46.0)
Hemoglobin: 10.3 g/dL — ABNORMAL LOW (ref 12.0–15.0)
LYMPHS ABS: 1.2 10*3/uL (ref 0.7–4.0)
Lymphocytes Relative: 24.1 % (ref 12.0–46.0)
MCHC: 32 g/dL (ref 30.0–36.0)
MCV: 82.8 fl (ref 78.0–100.0)
MONO ABS: 0.5 10*3/uL (ref 0.1–1.0)
MONOS PCT: 9.7 % (ref 3.0–12.0)
NEUTROS ABS: 3.3 10*3/uL (ref 1.4–7.7)
NEUTROS PCT: 64.3 % (ref 43.0–77.0)
PLATELETS: 254 10*3/uL (ref 150.0–400.0)
RBC: 3.91 Mil/uL (ref 3.87–5.11)
RDW: 17.6 % — AB (ref 11.5–15.5)
WBC: 5.1 10*3/uL (ref 4.0–10.5)

## 2018-10-24 LAB — COMPREHENSIVE METABOLIC PANEL
ALK PHOS: 84 U/L (ref 39–117)
ALT: 9 U/L (ref 0–35)
AST: 17 U/L (ref 0–37)
Albumin: 3.8 g/dL (ref 3.5–5.2)
BUN: 16 mg/dL (ref 6–23)
CHLORIDE: 106 meq/L (ref 96–112)
CO2: 34 meq/L — AB (ref 19–32)
Calcium: 9.4 mg/dL (ref 8.4–10.5)
Creatinine, Ser: 0.93 mg/dL (ref 0.40–1.20)
GFR: 62.4 mL/min (ref >60.00)
GLUCOSE: 85 mg/dL (ref 70–99)
POTASSIUM: 3.8 meq/L (ref 3.5–5.1)
SODIUM: 146 meq/L — AB (ref 135–145)
TOTAL PROTEIN: 6 g/dL (ref 6.0–8.3)
Total Bilirubin: 0.3 mg/dL (ref 0.2–1.2)

## 2018-10-24 LAB — PROTIME-INR
INR: 1 ratio (ref 0.8–1.0)
PROTHROMBIN TIME: 11.9 s (ref 9.6–13.1)

## 2018-10-24 NOTE — Progress Notes (Signed)
Chief Complaint: obscure GI bleeding  Referring Provider:  Marco Collie, MD      ASSESSMENT AND PLAN;   #1.  Obscure GI bleeding: neg EGD 2 weeks ago by Dr Jerilynn Mages except for [facility], s/p 2U PRBC and IV iron (2 doses). Neg Colon 08/2012, 02/2014 except for small tubular adenomas (Dr Jerilynn Mages), neg colon with TI intubation(05/08/2017). Neg EGD 05/07/2017 except for large HH, incidental gastric polyps, neg SB bx for celiac. Followed by Dr Bobby Rumpf (hematology). S/p multiple transfusions in past.  #2.  Comorbid conditions including CAD, CHF, OA, HTN, COPD, HLD, B12 def, mild dementia. H/O PUD 12/2016.  Plan: - Proceed with CTA abdomen/pelvis. - Check CBC, CMP, celiac screen, PT today. - Agreed to hold off on Plavix - If CTA is negative, proceed with capsule endoscopy followed by ? Enteroscopy if needed. - Cardiology appt with Dr Raliegh Ip - Pt and pt's family to call us with List of meds. - Obtain records regarding recent hospitalization to [facility].  HPI:    Tracy Clark is a 77 y.o. female  Seen as an emergency work in No records available from recent discharge from [facility] (2 weeks ago)  Patient had melanotic stools with severe anemia per family, adm to [facility] 2 weeks ago, s/p 2U PRBC, Plavix was held, she was given 2 doses of IV iron ferraheme, seen Dr. Lyda Jester and underwent EGD which showed hiatal hernia but negative for upper GI bleeding.  No NSAIDs.  No nausea, vomiting, heartburn, regurgitation, odynophagia or dysphagia.  No significant diarrhea or constipation.  There is no further melena or hematochezia. No unintentional weight loss.   Past Medical History:  Diagnosis Date  . Anemia   . CHF (congestive heart failure) ([place])   . Chronic ischemic heart disease   . COPD (chronic obstructive pulmonary disease) ([place])   . Dementia ([place])   . GERD (gastroesophageal reflux disease)   . History of gastrointestinal bleeding   . Hx of peptic ulcer   . Hyperlipidemia   .  Osteoarthritis   . RLS (restless legs syndrome)   . Vitamin B 12 deficiency     Past Surgical History:  Procedure Laterality Date  . APPENDECTOMY    . Catherization    . CHOLECYSTECTOMY    . COLONOSCOPY  05/09/2017   Mild sigmoid diverticulosis.   Marland Kitchen ESOPHAGOGASTRODUODENOSCOPY  05/07/2017   Large hiatal hernia. Incidental gastric polyps. Mild gastritis. No evidence of upper GI bleeding.   Marland Kitchen LEG SURGERY    . SHOULDER SURGERY      History reviewed. No pertinent family history.  Social History   Tobacco Use  . Smoking status: Former Smoker    Last attempt to quit: 10/25/2003    Years since quitting: 15.0  . Smokeless tobacco: Never Used  Substance Use Topics  . Alcohol use: Not Currently  . Drug use: Never    No current outpatient medications on file.   No current facility-administered medications for this visit.     Allergies not on file  Review of Systems:  Constitutional: Denies fever, chills, diaphoresis, appetite change and fatigue.  HEENT: Denies photophobia, eye pain, redness, hearing loss, ear pain, congestion, sore throat, rhinorrhea, sneezing, mouth sores, neck pain, neck stiffness and tinnitus.   Respiratory: has SOB, DOE, cough, chest tightness,  and wheezing.   Cardiovascular: Denies chest pain, palpitations and leg swelling.  Genitourinary: Denies dysuria, urgency, frequency, hematuria, flank pain and difficulty urinating.  Musculoskeletal: Denies myalgias, has back pain, NO  joint swelling, arthralgias and gait problem.  Skin: No rash.  Neurological: Denies dizziness, seizures, syncope, weakness, light-headedness, numbness and headaches.  Hematological: Denies adenopathy. Easy bruising, personal or family bleeding history  Psychiatric/Behavioral: Has anxiety or depression.     Physical Exam:    BP 140/80   Pulse 74   Ht 5' 1.75" (1.568 m)   Wt 190 lb (86.2 kg)   SpO2 96%   BMI 35.03 kg/m  Filed Weights   10/24/18 1005  Weight: 190 lb (86.2 kg)    Constitutional:  Well-developed, in no acute distress. Psychiatric: Normal mood and affect. Behavior is normal. HEENT: Pupils normal.  Conjunctivae are normal. No scleral icterus. Positive pallor Neck supple.  Cardiovascular: Normal rate, regular rhythm. No edema Pulmonary/chest: Effort normal and breath sounds normal. No wheezing, rales or rhonchi. Abdominal: Soft, nondistended. Nontender. Bowel sounds active throughout. There are no masses palpable. No hepatomegaly. Rectal:  defered Neurological: Alert and oriented to person place and time. Skin: Skin is warm and dry. No rashes noted.   Carmell Austria, MD 10/24/2018, 10:28 AM  Cc: Marco Collie, MD

## 2018-10-24 NOTE — Patient Instructions (Addendum)
You have been scheduled for a CTA scan of the abdomen and pelvis at [facility] are scheduled on 10/28/18 at [place] should arrive 15 minutes prior to your appointment time for registration. Please follow the written instructions below on the day of your exam:  WARNING: IF YOU ARE ALLERGIC TO IODINE/X-RAY DYE, PLEASE NOTIFY RADIOLOGY IMMEDIATELY AT [phone]! YOU WILL BE GIVEN A 13 HOUR PREMEDICATION PREP.  1) Do not eat or drink anything after 5am (4 hours prior to your test) 2) You have been given 2 bottles of oral contrast to drink. The solution may taste better if refrigerated, but do NOT add ice or any other liquid to this solution. Shake well before drinking.    You may take any medications as prescribed with a small amount of water, if necessary. If you take any of the following medications: METFORMIN, GLUCOPHAGE, GLUCOVANCE, AVANDAMET, RIOMET, FORTAMET, [place] MET, JANUMET, GLUMETZA or METAGLIP, you MAY be asked to HOLD this medication 48 hours AFTER the exam.  The purpose of you drinking the oral contrast is to aid in the visualization of your intestinal tract. The contrast solution may cause some diarrhea. Depending on your individual set of symptoms, you may also receive an intravenous injection of x-ray contrast/dye. Plan on being at [facility] for 30 minutes or longer, depending on the type of exam you are having performed.  This test typically takes 30-45 minutes to complete.  If you have any questions regarding your exam or if you need to reschedule, you may call the CT department at [phone] between the hours of 8:00 am and 5:00 pm, Monday-Friday.  ________________________________________________________________________   Please go to the lab on the 2nd floor [address] before you leave the office today.   It has been recommended to you by your physician that you have a(n) Colonoscopy completed. We did not schedule the procedure(s) today. We will  contact you with this appointment.  You have been referred to Dr Agustin Cree for Cardiology. You will be contacted with this appointment.    Thank you,  Dr. Jackquline Denmark

## 2018-10-25 ENCOUNTER — Telehealth: Payer: Self-pay

## 2018-10-25 NOTE — Telephone Encounter (Signed)
Patients CT scan is rescheduled for 11/01/18 at 10:30am, patient is aware.

## 2018-10-28 ENCOUNTER — Ambulatory Visit (HOSPITAL_BASED_OUTPATIENT_CLINIC_OR_DEPARTMENT_OTHER): Payer: Medicare HMO

## 2018-10-31 DIAGNOSIS — G3183 Dementia with Lewy bodies: Secondary | ICD-10-CM | POA: Diagnosis not present

## 2018-10-31 DIAGNOSIS — R159 Full incontinence of feces: Secondary | ICD-10-CM | POA: Diagnosis not present

## 2018-10-31 DIAGNOSIS — R3981 Functional urinary incontinence: Secondary | ICD-10-CM | POA: Diagnosis not present

## 2018-10-31 DIAGNOSIS — N39490 Overflow incontinence: Secondary | ICD-10-CM | POA: Diagnosis not present

## 2018-11-01 ENCOUNTER — Ambulatory Visit (HOSPITAL_BASED_OUTPATIENT_CLINIC_OR_DEPARTMENT_OTHER)
Admission: RE | Admit: 2018-11-01 | Discharge: 2018-11-01 | Disposition: A | Discharge status: Home / Self Care | Payer: Medicare HMO | Source: Ambulatory Visit | Attending: Gastroenterology | Admitting: Gastroenterology

## 2018-11-01 ENCOUNTER — Encounter (HOSPITAL_BASED_OUTPATIENT_CLINIC_OR_DEPARTMENT_OTHER): Payer: Self-pay

## 2018-11-01 DIAGNOSIS — R195 Other fecal abnormalities: Secondary | ICD-10-CM

## 2018-11-01 DIAGNOSIS — M5136 Other intervertebral disc degeneration, lumbar region: Secondary | ICD-10-CM | POA: Insufficient documentation

## 2018-11-01 DIAGNOSIS — M4316 Spondylolisthesis, lumbar region: Secondary | ICD-10-CM | POA: Insufficient documentation

## 2018-11-01 DIAGNOSIS — K5710 Diverticulosis of small intestine without perforation or abscess without bleeding: Secondary | ICD-10-CM | POA: Insufficient documentation

## 2018-11-01 DIAGNOSIS — K449 Diaphragmatic hernia without obstruction or gangrene: Secondary | ICD-10-CM | POA: Insufficient documentation

## 2018-11-01 HISTORY — DX: Essential (primary) hypertension: I10

## 2018-11-01 MED ORDER — IOPAMIDOL (ISOVUE-370) INJECTION 76%
100.0000 mL | Freq: Once | INTRAVENOUS | Status: AC | PRN
  Administered 2018-11-01: 100 mL via INTRAVENOUS

## 2018-11-03 DIAGNOSIS — J449 Chronic obstructive pulmonary disease, unspecified: Secondary | ICD-10-CM | POA: Diagnosis not present

## 2018-11-04 DIAGNOSIS — J9601 Acute respiratory failure with hypoxia: Secondary | ICD-10-CM | POA: Diagnosis not present

## 2018-11-04 DIAGNOSIS — Z9981 Dependence on supplemental oxygen: Secondary | ICD-10-CM | POA: Diagnosis not present

## 2018-11-04 DIAGNOSIS — J411 Mucopurulent chronic bronchitis: Secondary | ICD-10-CM | POA: Diagnosis not present

## 2018-11-04 DIAGNOSIS — J449 Chronic obstructive pulmonary disease, unspecified: Secondary | ICD-10-CM | POA: Diagnosis not present

## 2018-11-06 DIAGNOSIS — J411 Mucopurulent chronic bronchitis: Secondary | ICD-10-CM | POA: Diagnosis not present

## 2018-11-23 DIAGNOSIS — J9610 Chronic respiratory failure, unspecified whether with hypoxia or hypercapnia: Secondary | ICD-10-CM | POA: Diagnosis not present

## 2018-11-23 DIAGNOSIS — I5020 Unspecified systolic (congestive) heart failure: Secondary | ICD-10-CM | POA: Diagnosis not present

## 2018-11-23 DIAGNOSIS — J449 Chronic obstructive pulmonary disease, unspecified: Secondary | ICD-10-CM | POA: Diagnosis not present

## 2018-11-23 DIAGNOSIS — N183 Chronic kidney disease, stage 3 (moderate): Secondary | ICD-10-CM | POA: Diagnosis not present

## 2018-11-27 ENCOUNTER — Ambulatory Visit (INDEPENDENT_AMBULATORY_CARE_PROVIDER_SITE_OTHER): Payer: Medicare HMO | Admitting: Gastroenterology

## 2018-11-27 DIAGNOSIS — K922 Gastrointestinal hemorrhage, unspecified: Secondary | ICD-10-CM

## 2018-11-27 NOTE — Progress Notes (Signed)
  Pt tolerated capsule well and verbalized understanding of the instructions.  LOT 02-12-2 SN [id number] EXP 01-17-2018

## 2018-11-28 DIAGNOSIS — J441 Chronic obstructive pulmonary disease with (acute) exacerbation: Secondary | ICD-10-CM | POA: Diagnosis not present

## 2018-11-28 DIAGNOSIS — F0150 Vascular dementia without behavioral disturbance: Secondary | ICD-10-CM | POA: Diagnosis not present

## 2018-11-28 DIAGNOSIS — Z6835 Body mass index (BMI) 35.0-35.9, adult: Secondary | ICD-10-CM | POA: Diagnosis not present

## 2018-12-03 DIAGNOSIS — F0390 Unspecified dementia without behavioral disturbance: Secondary | ICD-10-CM | POA: Diagnosis not present

## 2018-12-03 DIAGNOSIS — I5020 Unspecified systolic (congestive) heart failure: Secondary | ICD-10-CM | POA: Diagnosis not present

## 2018-12-03 DIAGNOSIS — J449 Chronic obstructive pulmonary disease, unspecified: Secondary | ICD-10-CM | POA: Diagnosis not present

## 2018-12-03 DIAGNOSIS — J441 Chronic obstructive pulmonary disease with (acute) exacerbation: Secondary | ICD-10-CM | POA: Diagnosis not present

## 2018-12-03 DIAGNOSIS — Z6835 Body mass index (BMI) 35.0-35.9, adult: Secondary | ICD-10-CM | POA: Diagnosis not present

## 2018-12-04 DIAGNOSIS — J411 Mucopurulent chronic bronchitis: Secondary | ICD-10-CM | POA: Diagnosis not present

## 2018-12-04 DIAGNOSIS — J9601 Acute respiratory failure with hypoxia: Secondary | ICD-10-CM | POA: Diagnosis not present

## 2018-12-04 DIAGNOSIS — Z9981 Dependence on supplemental oxygen: Secondary | ICD-10-CM | POA: Diagnosis not present

## 2018-12-04 DIAGNOSIS — J449 Chronic obstructive pulmonary disease, unspecified: Secondary | ICD-10-CM | POA: Diagnosis not present

## 2018-12-06 DIAGNOSIS — J411 Mucopurulent chronic bronchitis: Secondary | ICD-10-CM | POA: Diagnosis not present

## 2018-12-12 DIAGNOSIS — J441 Chronic obstructive pulmonary disease with (acute) exacerbation: Secondary | ICD-10-CM | POA: Diagnosis not present

## 2018-12-12 DIAGNOSIS — J9610 Chronic respiratory failure, unspecified whether with hypoxia or hypercapnia: Secondary | ICD-10-CM | POA: Diagnosis not present

## 2018-12-12 DIAGNOSIS — Z6834 Body mass index (BMI) 34.0-34.9, adult: Secondary | ICD-10-CM | POA: Diagnosis not present

## 2018-12-12 DIAGNOSIS — Z9981 Dependence on supplemental oxygen: Secondary | ICD-10-CM | POA: Diagnosis not present

## 2018-12-24 DIAGNOSIS — J441 Chronic obstructive pulmonary disease with (acute) exacerbation: Secondary | ICD-10-CM | POA: Diagnosis not present

## 2018-12-24 DIAGNOSIS — J9610 Chronic respiratory failure, unspecified whether with hypoxia or hypercapnia: Secondary | ICD-10-CM | POA: Diagnosis not present

## 2018-12-24 DIAGNOSIS — Z6834 Body mass index (BMI) 34.0-34.9, adult: Secondary | ICD-10-CM | POA: Diagnosis not present

## 2018-12-24 DIAGNOSIS — Z9981 Dependence on supplemental oxygen: Secondary | ICD-10-CM | POA: Diagnosis not present

## 2019-01-02 DIAGNOSIS — J449 Chronic obstructive pulmonary disease, unspecified: Secondary | ICD-10-CM | POA: Diagnosis not present

## 2019-01-03 DIAGNOSIS — J449 Chronic obstructive pulmonary disease, unspecified: Secondary | ICD-10-CM | POA: Diagnosis not present

## 2019-01-04 DIAGNOSIS — Z9981 Dependence on supplemental oxygen: Secondary | ICD-10-CM | POA: Diagnosis not present

## 2019-01-04 DIAGNOSIS — J449 Chronic obstructive pulmonary disease, unspecified: Secondary | ICD-10-CM | POA: Diagnosis not present

## 2019-01-04 DIAGNOSIS — J411 Mucopurulent chronic bronchitis: Secondary | ICD-10-CM | POA: Diagnosis not present

## 2019-01-04 DIAGNOSIS — J9601 Acute respiratory failure with hypoxia: Secondary | ICD-10-CM | POA: Diagnosis not present

## 2019-01-06 DIAGNOSIS — J411 Mucopurulent chronic bronchitis: Secondary | ICD-10-CM | POA: Diagnosis not present

## 2019-01-24 DIAGNOSIS — R159 Full incontinence of feces: Secondary | ICD-10-CM | POA: Diagnosis not present

## 2019-01-24 DIAGNOSIS — Z6834 Body mass index (BMI) 34.0-34.9, adult: Secondary | ICD-10-CM | POA: Diagnosis not present

## 2019-01-24 DIAGNOSIS — N39490 Overflow incontinence: Secondary | ICD-10-CM | POA: Diagnosis not present

## 2019-01-24 DIAGNOSIS — J441 Chronic obstructive pulmonary disease with (acute) exacerbation: Secondary | ICD-10-CM | POA: Diagnosis not present

## 2019-01-24 DIAGNOSIS — Z9981 Dependence on supplemental oxygen: Secondary | ICD-10-CM | POA: Diagnosis not present

## 2019-01-24 DIAGNOSIS — G3183 Dementia with Lewy bodies: Secondary | ICD-10-CM | POA: Diagnosis not present

## 2019-01-24 DIAGNOSIS — R3981 Functional urinary incontinence: Secondary | ICD-10-CM | POA: Diagnosis not present

## 2019-01-24 DIAGNOSIS — J9610 Chronic respiratory failure, unspecified whether with hypoxia or hypercapnia: Secondary | ICD-10-CM | POA: Diagnosis not present

## 2019-02-03 DIAGNOSIS — J449 Chronic obstructive pulmonary disease, unspecified: Secondary | ICD-10-CM | POA: Diagnosis not present

## 2019-02-04 DIAGNOSIS — J411 Mucopurulent chronic bronchitis: Secondary | ICD-10-CM | POA: Diagnosis not present

## 2019-02-04 DIAGNOSIS — J9601 Acute respiratory failure with hypoxia: Secondary | ICD-10-CM | POA: Diagnosis not present

## 2019-02-04 DIAGNOSIS — Z9981 Dependence on supplemental oxygen: Secondary | ICD-10-CM | POA: Diagnosis not present

## 2019-02-04 DIAGNOSIS — J449 Chronic obstructive pulmonary disease, unspecified: Secondary | ICD-10-CM | POA: Diagnosis not present

## 2019-02-06 DIAGNOSIS — J411 Mucopurulent chronic bronchitis: Secondary | ICD-10-CM | POA: Diagnosis not present

## 2019-02-20 DIAGNOSIS — R234 Changes in skin texture: Secondary | ICD-10-CM | POA: Diagnosis not present

## 2019-02-20 DIAGNOSIS — Z6833 Body mass index (BMI) 33.0-33.9, adult: Secondary | ICD-10-CM | POA: Diagnosis not present

## 2019-02-21 DIAGNOSIS — J441 Chronic obstructive pulmonary disease with (acute) exacerbation: Secondary | ICD-10-CM | POA: Diagnosis not present

## 2019-02-21 DIAGNOSIS — J9610 Chronic respiratory failure, unspecified whether with hypoxia or hypercapnia: Secondary | ICD-10-CM | POA: Diagnosis not present

## 2019-02-21 DIAGNOSIS — I5020 Unspecified systolic (congestive) heart failure: Secondary | ICD-10-CM | POA: Diagnosis not present

## 2019-02-21 DIAGNOSIS — Z9981 Dependence on supplemental oxygen: Secondary | ICD-10-CM | POA: Diagnosis not present

## 2019-03-04 DIAGNOSIS — J449 Chronic obstructive pulmonary disease, unspecified: Secondary | ICD-10-CM | POA: Diagnosis not present

## 2019-03-05 DIAGNOSIS — J411 Mucopurulent chronic bronchitis: Secondary | ICD-10-CM | POA: Diagnosis not present

## 2019-03-05 DIAGNOSIS — J449 Chronic obstructive pulmonary disease, unspecified: Secondary | ICD-10-CM | POA: Diagnosis not present

## 2019-03-05 DIAGNOSIS — Z9981 Dependence on supplemental oxygen: Secondary | ICD-10-CM | POA: Diagnosis not present

## 2019-03-05 DIAGNOSIS — J9601 Acute respiratory failure with hypoxia: Secondary | ICD-10-CM | POA: Diagnosis not present

## 2019-03-07 DIAGNOSIS — J411 Mucopurulent chronic bronchitis: Secondary | ICD-10-CM | POA: Diagnosis not present

## 2019-03-17 DIAGNOSIS — N39490 Overflow incontinence: Secondary | ICD-10-CM | POA: Diagnosis not present

## 2019-03-17 DIAGNOSIS — R3981 Functional urinary incontinence: Secondary | ICD-10-CM | POA: Diagnosis not present

## 2019-03-17 DIAGNOSIS — G3183 Dementia with Lewy bodies: Secondary | ICD-10-CM | POA: Diagnosis not present

## 2019-03-17 DIAGNOSIS — R159 Full incontinence of feces: Secondary | ICD-10-CM | POA: Diagnosis not present

## 2019-03-25 DIAGNOSIS — F0390 Unspecified dementia without behavioral disturbance: Secondary | ICD-10-CM | POA: Diagnosis not present

## 2019-03-25 DIAGNOSIS — J441 Chronic obstructive pulmonary disease with (acute) exacerbation: Secondary | ICD-10-CM | POA: Diagnosis not present

## 2019-03-25 DIAGNOSIS — R82998 Other abnormal findings in urine: Secondary | ICD-10-CM | POA: Diagnosis not present

## 2019-03-25 DIAGNOSIS — I5020 Unspecified systolic (congestive) heart failure: Secondary | ICD-10-CM | POA: Diagnosis not present

## 2019-03-25 DIAGNOSIS — J9610 Chronic respiratory failure, unspecified whether with hypoxia or hypercapnia: Secondary | ICD-10-CM | POA: Diagnosis not present

## 2019-03-25 DIAGNOSIS — R296 Repeated falls: Secondary | ICD-10-CM | POA: Diagnosis not present

## 2019-03-25 DIAGNOSIS — N390 Urinary tract infection, site not specified: Secondary | ICD-10-CM | POA: Diagnosis not present

## 2019-03-25 DIAGNOSIS — D509 Iron deficiency anemia, unspecified: Secondary | ICD-10-CM | POA: Diagnosis not present

## 2019-03-25 DIAGNOSIS — Z7189 Other specified counseling: Secondary | ICD-10-CM | POA: Diagnosis not present

## 2019-04-04 DIAGNOSIS — J449 Chronic obstructive pulmonary disease, unspecified: Secondary | ICD-10-CM | POA: Diagnosis not present

## 2019-04-05 DIAGNOSIS — J9601 Acute respiratory failure with hypoxia: Secondary | ICD-10-CM | POA: Diagnosis not present

## 2019-04-05 DIAGNOSIS — J449 Chronic obstructive pulmonary disease, unspecified: Secondary | ICD-10-CM | POA: Diagnosis not present

## 2019-04-05 DIAGNOSIS — J411 Mucopurulent chronic bronchitis: Secondary | ICD-10-CM | POA: Diagnosis not present

## 2019-04-05 DIAGNOSIS — Z9981 Dependence on supplemental oxygen: Secondary | ICD-10-CM | POA: Diagnosis not present

## 2019-04-07 DIAGNOSIS — J411 Mucopurulent chronic bronchitis: Secondary | ICD-10-CM | POA: Diagnosis not present

## 2019-04-24 DIAGNOSIS — F0390 Unspecified dementia without behavioral disturbance: Secondary | ICD-10-CM | POA: Diagnosis not present

## 2019-04-24 DIAGNOSIS — I5020 Unspecified systolic (congestive) heart failure: Secondary | ICD-10-CM | POA: Diagnosis not present

## 2019-04-24 DIAGNOSIS — J441 Chronic obstructive pulmonary disease with (acute) exacerbation: Secondary | ICD-10-CM | POA: Diagnosis not present

## 2019-04-24 DIAGNOSIS — J9610 Chronic respiratory failure, unspecified whether with hypoxia or hypercapnia: Secondary | ICD-10-CM | POA: Diagnosis not present

## 2019-05-04 DIAGNOSIS — J449 Chronic obstructive pulmonary disease, unspecified: Secondary | ICD-10-CM | POA: Diagnosis not present

## 2019-05-05 DIAGNOSIS — J9601 Acute respiratory failure with hypoxia: Secondary | ICD-10-CM | POA: Diagnosis not present

## 2019-05-05 DIAGNOSIS — J449 Chronic obstructive pulmonary disease, unspecified: Secondary | ICD-10-CM | POA: Diagnosis not present

## 2019-05-05 DIAGNOSIS — J411 Mucopurulent chronic bronchitis: Secondary | ICD-10-CM | POA: Diagnosis not present

## 2019-05-05 DIAGNOSIS — Z9981 Dependence on supplemental oxygen: Secondary | ICD-10-CM | POA: Diagnosis not present

## 2019-05-23 DIAGNOSIS — J441 Chronic obstructive pulmonary disease with (acute) exacerbation: Secondary | ICD-10-CM | POA: Diagnosis not present

## 2019-05-23 DIAGNOSIS — J9610 Chronic respiratory failure, unspecified whether with hypoxia or hypercapnia: Secondary | ICD-10-CM | POA: Diagnosis not present

## 2019-05-23 DIAGNOSIS — F0390 Unspecified dementia without behavioral disturbance: Secondary | ICD-10-CM | POA: Diagnosis not present

## 2019-05-23 DIAGNOSIS — I5020 Unspecified systolic (congestive) heart failure: Secondary | ICD-10-CM | POA: Diagnosis not present

## 2019-05-30 DIAGNOSIS — R159 Full incontinence of feces: Secondary | ICD-10-CM | POA: Diagnosis not present

## 2019-05-30 DIAGNOSIS — R3981 Functional urinary incontinence: Secondary | ICD-10-CM | POA: Diagnosis not present

## 2019-05-30 DIAGNOSIS — G3183 Dementia with Lewy bodies: Secondary | ICD-10-CM | POA: Diagnosis not present

## 2019-05-30 DIAGNOSIS — N39490 Overflow incontinence: Secondary | ICD-10-CM | POA: Diagnosis not present

## 2019-06-04 DIAGNOSIS — J449 Chronic obstructive pulmonary disease, unspecified: Secondary | ICD-10-CM | POA: Diagnosis not present

## 2019-06-05 DIAGNOSIS — J449 Chronic obstructive pulmonary disease, unspecified: Secondary | ICD-10-CM | POA: Diagnosis not present

## 2019-06-05 DIAGNOSIS — J9601 Acute respiratory failure with hypoxia: Secondary | ICD-10-CM | POA: Diagnosis not present

## 2019-06-05 DIAGNOSIS — Z9981 Dependence on supplemental oxygen: Secondary | ICD-10-CM | POA: Diagnosis not present

## 2019-06-05 DIAGNOSIS — J411 Mucopurulent chronic bronchitis: Secondary | ICD-10-CM | POA: Diagnosis not present

## 2019-06-24 DIAGNOSIS — J441 Chronic obstructive pulmonary disease with (acute) exacerbation: Secondary | ICD-10-CM | POA: Diagnosis not present

## 2019-06-24 DIAGNOSIS — J9610 Chronic respiratory failure, unspecified whether with hypoxia or hypercapnia: Secondary | ICD-10-CM | POA: Diagnosis not present

## 2019-06-24 DIAGNOSIS — F0390 Unspecified dementia without behavioral disturbance: Secondary | ICD-10-CM | POA: Diagnosis not present

## 2019-06-24 DIAGNOSIS — I5020 Unspecified systolic (congestive) heart failure: Secondary | ICD-10-CM | POA: Diagnosis not present

## 2019-07-04 DIAGNOSIS — J449 Chronic obstructive pulmonary disease, unspecified: Secondary | ICD-10-CM | POA: Diagnosis not present

## 2019-07-05 DIAGNOSIS — J411 Mucopurulent chronic bronchitis: Secondary | ICD-10-CM | POA: Diagnosis not present

## 2019-07-05 DIAGNOSIS — Z9981 Dependence on supplemental oxygen: Secondary | ICD-10-CM | POA: Diagnosis not present

## 2019-07-05 DIAGNOSIS — J9601 Acute respiratory failure with hypoxia: Secondary | ICD-10-CM | POA: Diagnosis not present

## 2019-07-05 DIAGNOSIS — J449 Chronic obstructive pulmonary disease, unspecified: Secondary | ICD-10-CM | POA: Diagnosis not present

## 2019-07-25 DIAGNOSIS — I5020 Unspecified systolic (congestive) heart failure: Secondary | ICD-10-CM | POA: Diagnosis not present

## 2019-07-25 DIAGNOSIS — F0390 Unspecified dementia without behavioral disturbance: Secondary | ICD-10-CM | POA: Diagnosis not present

## 2019-07-25 DIAGNOSIS — J441 Chronic obstructive pulmonary disease with (acute) exacerbation: Secondary | ICD-10-CM | POA: Diagnosis not present

## 2019-07-25 DIAGNOSIS — J9610 Chronic respiratory failure, unspecified whether with hypoxia or hypercapnia: Secondary | ICD-10-CM | POA: Diagnosis not present

## 2019-07-31 DIAGNOSIS — J9610 Chronic respiratory failure, unspecified whether with hypoxia or hypercapnia: Secondary | ICD-10-CM | POA: Diagnosis not present

## 2019-07-31 DIAGNOSIS — J449 Chronic obstructive pulmonary disease, unspecified: Secondary | ICD-10-CM | POA: Diagnosis not present

## 2019-07-31 DIAGNOSIS — R0602 Shortness of breath: Secondary | ICD-10-CM | POA: Diagnosis not present

## 2019-07-31 DIAGNOSIS — R05 Cough: Secondary | ICD-10-CM | POA: Diagnosis not present

## 2019-08-04 DIAGNOSIS — J449 Chronic obstructive pulmonary disease, unspecified: Secondary | ICD-10-CM | POA: Diagnosis not present

## 2019-08-04 DIAGNOSIS — J441 Chronic obstructive pulmonary disease with (acute) exacerbation: Secondary | ICD-10-CM | POA: Diagnosis not present

## 2019-08-04 DIAGNOSIS — Z6833 Body mass index (BMI) 33.0-33.9, adult: Secondary | ICD-10-CM | POA: Diagnosis not present

## 2019-08-05 DIAGNOSIS — J411 Mucopurulent chronic bronchitis: Secondary | ICD-10-CM | POA: Diagnosis not present

## 2019-08-05 DIAGNOSIS — J449 Chronic obstructive pulmonary disease, unspecified: Secondary | ICD-10-CM | POA: Diagnosis not present

## 2019-08-05 DIAGNOSIS — Z9981 Dependence on supplemental oxygen: Secondary | ICD-10-CM | POA: Diagnosis not present

## 2019-08-05 DIAGNOSIS — J9601 Acute respiratory failure with hypoxia: Secondary | ICD-10-CM | POA: Diagnosis not present

## 2019-08-08 DIAGNOSIS — I700 Atherosclerosis of aorta: Secondary | ICD-10-CM | POA: Diagnosis not present

## 2019-08-08 DIAGNOSIS — K449 Diaphragmatic hernia without obstruction or gangrene: Secondary | ICD-10-CM | POA: Diagnosis not present

## 2019-08-08 DIAGNOSIS — J988 Other specified respiratory disorders: Secondary | ICD-10-CM | POA: Diagnosis not present

## 2019-08-08 DIAGNOSIS — J984 Other disorders of lung: Secondary | ICD-10-CM | POA: Diagnosis not present

## 2019-08-08 DIAGNOSIS — I2510 Atherosclerotic heart disease of native coronary artery without angina pectoris: Secondary | ICD-10-CM | POA: Diagnosis not present

## 2019-08-08 DIAGNOSIS — D7389 Other diseases of spleen: Secondary | ICD-10-CM | POA: Diagnosis not present

## 2019-08-08 DIAGNOSIS — I4510 Unspecified right bundle-branch block: Secondary | ICD-10-CM | POA: Diagnosis not present

## 2019-08-08 DIAGNOSIS — E876 Hypokalemia: Secondary | ICD-10-CM | POA: Diagnosis not present

## 2019-08-08 DIAGNOSIS — R05 Cough: Secondary | ICD-10-CM | POA: Diagnosis not present

## 2019-08-08 DIAGNOSIS — M47814 Spondylosis without myelopathy or radiculopathy, thoracic region: Secondary | ICD-10-CM | POA: Diagnosis not present

## 2019-08-08 DIAGNOSIS — J8410 Pulmonary fibrosis, unspecified: Secondary | ICD-10-CM | POA: Diagnosis not present

## 2019-08-14 DIAGNOSIS — Z139 Encounter for screening, unspecified: Secondary | ICD-10-CM | POA: Diagnosis not present

## 2019-08-14 DIAGNOSIS — I5020 Unspecified systolic (congestive) heart failure: Secondary | ICD-10-CM | POA: Diagnosis not present

## 2019-08-14 DIAGNOSIS — J449 Chronic obstructive pulmonary disease, unspecified: Secondary | ICD-10-CM | POA: Diagnosis not present

## 2019-08-14 DIAGNOSIS — N183 Chronic kidney disease, stage 3 (moderate): Secondary | ICD-10-CM | POA: Diagnosis not present

## 2019-08-14 DIAGNOSIS — E785 Hyperlipidemia, unspecified: Secondary | ICD-10-CM | POA: Diagnosis not present

## 2019-08-14 DIAGNOSIS — Z7689 Persons encountering health services in other specified circumstances: Secondary | ICD-10-CM | POA: Diagnosis not present

## 2019-08-14 DIAGNOSIS — F0150 Vascular dementia without behavioral disturbance: Secondary | ICD-10-CM | POA: Diagnosis not present

## 2019-08-14 DIAGNOSIS — I129 Hypertensive chronic kidney disease with stage 1 through stage 4 chronic kidney disease, or unspecified chronic kidney disease: Secondary | ICD-10-CM | POA: Diagnosis not present

## 2019-08-14 DIAGNOSIS — Z0000 Encounter for general adult medical examination without abnormal findings: Secondary | ICD-10-CM | POA: Diagnosis not present

## 2019-08-14 DIAGNOSIS — Z1331 Encounter for screening for depression: Secondary | ICD-10-CM | POA: Diagnosis not present

## 2019-08-25 DIAGNOSIS — I129 Hypertensive chronic kidney disease with stage 1 through stage 4 chronic kidney disease, or unspecified chronic kidney disease: Secondary | ICD-10-CM | POA: Diagnosis not present

## 2019-08-25 DIAGNOSIS — N183 Chronic kidney disease, stage 3 (moderate): Secondary | ICD-10-CM | POA: Diagnosis not present

## 2019-08-25 DIAGNOSIS — J449 Chronic obstructive pulmonary disease, unspecified: Secondary | ICD-10-CM | POA: Diagnosis not present

## 2019-09-04 DIAGNOSIS — J449 Chronic obstructive pulmonary disease, unspecified: Secondary | ICD-10-CM | POA: Diagnosis not present

## 2019-09-05 DIAGNOSIS — J449 Chronic obstructive pulmonary disease, unspecified: Secondary | ICD-10-CM | POA: Diagnosis not present

## 2019-09-05 DIAGNOSIS — I5020 Unspecified systolic (congestive) heart failure: Secondary | ICD-10-CM | POA: Diagnosis not present

## 2019-09-05 DIAGNOSIS — J411 Mucopurulent chronic bronchitis: Secondary | ICD-10-CM | POA: Diagnosis not present

## 2019-09-05 DIAGNOSIS — Z9981 Dependence on supplemental oxygen: Secondary | ICD-10-CM | POA: Diagnosis not present

## 2019-09-05 DIAGNOSIS — F0150 Vascular dementia without behavioral disturbance: Secondary | ICD-10-CM | POA: Diagnosis not present

## 2019-09-05 DIAGNOSIS — J9610 Chronic respiratory failure, unspecified whether with hypoxia or hypercapnia: Secondary | ICD-10-CM | POA: Diagnosis not present

## 2019-09-05 DIAGNOSIS — N183 Chronic kidney disease, stage 3 (moderate): Secondary | ICD-10-CM | POA: Diagnosis not present

## 2019-09-05 DIAGNOSIS — J9601 Acute respiratory failure with hypoxia: Secondary | ICD-10-CM | POA: Diagnosis not present

## 2019-09-05 DIAGNOSIS — E46 Unspecified protein-calorie malnutrition: Secondary | ICD-10-CM | POA: Diagnosis not present

## 2019-09-11 DIAGNOSIS — N39490 Overflow incontinence: Secondary | ICD-10-CM | POA: Diagnosis not present

## 2019-09-11 DIAGNOSIS — R3981 Functional urinary incontinence: Secondary | ICD-10-CM | POA: Diagnosis not present

## 2019-09-11 DIAGNOSIS — R159 Full incontinence of feces: Secondary | ICD-10-CM | POA: Diagnosis not present

## 2019-09-11 DIAGNOSIS — G3183 Dementia with Lewy bodies: Secondary | ICD-10-CM | POA: Diagnosis not present

## 2019-09-24 DIAGNOSIS — I5020 Unspecified systolic (congestive) heart failure: Secondary | ICD-10-CM | POA: Diagnosis not present

## 2019-09-24 DIAGNOSIS — J449 Chronic obstructive pulmonary disease, unspecified: Secondary | ICD-10-CM | POA: Diagnosis not present

## 2019-09-24 DIAGNOSIS — N183 Chronic kidney disease, stage 3 (moderate): Secondary | ICD-10-CM | POA: Diagnosis not present

## 2019-09-24 DIAGNOSIS — F0150 Vascular dementia without behavioral disturbance: Secondary | ICD-10-CM | POA: Diagnosis not present

## 2019-10-04 DIAGNOSIS — J449 Chronic obstructive pulmonary disease, unspecified: Secondary | ICD-10-CM | POA: Diagnosis not present

## 2019-10-05 DIAGNOSIS — J9601 Acute respiratory failure with hypoxia: Secondary | ICD-10-CM | POA: Diagnosis not present

## 2019-10-05 DIAGNOSIS — J449 Chronic obstructive pulmonary disease, unspecified: Secondary | ICD-10-CM | POA: Diagnosis not present

## 2019-10-05 DIAGNOSIS — J411 Mucopurulent chronic bronchitis: Secondary | ICD-10-CM | POA: Diagnosis not present

## 2019-10-05 DIAGNOSIS — Z9981 Dependence on supplemental oxygen: Secondary | ICD-10-CM | POA: Diagnosis not present

## 2019-10-24 DIAGNOSIS — J9610 Chronic respiratory failure, unspecified whether with hypoxia or hypercapnia: Secondary | ICD-10-CM | POA: Diagnosis not present

## 2019-10-24 DIAGNOSIS — I5020 Unspecified systolic (congestive) heart failure: Secondary | ICD-10-CM | POA: Diagnosis not present

## 2019-10-24 DIAGNOSIS — J449 Chronic obstructive pulmonary disease, unspecified: Secondary | ICD-10-CM | POA: Diagnosis not present

## 2019-10-29 ENCOUNTER — Encounter: Payer: Self-pay | Admitting: Critical Care Medicine

## 2019-10-29 ENCOUNTER — Other Ambulatory Visit: Payer: Self-pay

## 2019-10-29 ENCOUNTER — Ambulatory Visit (INDEPENDENT_AMBULATORY_CARE_PROVIDER_SITE_OTHER): Payer: Medicare HMO | Admitting: Critical Care Medicine

## 2019-10-29 VITALS — BP 124/56 | HR 78 | Temp 97.0°F | Ht 64.0 in | Wt 196.4 lb

## 2019-10-29 DIAGNOSIS — J9611 Chronic respiratory failure with hypoxia: Secondary | ICD-10-CM | POA: Diagnosis not present

## 2019-10-29 DIAGNOSIS — R911 Solitary pulmonary nodule: Secondary | ICD-10-CM | POA: Diagnosis not present

## 2019-10-29 DIAGNOSIS — Z23 Encounter for immunization: Secondary | ICD-10-CM | POA: Diagnosis not present

## 2019-10-29 DIAGNOSIS — R918 Other nonspecific abnormal finding of lung field: Secondary | ICD-10-CM | POA: Diagnosis not present

## 2019-10-29 DIAGNOSIS — R0602 Shortness of breath: Secondary | ICD-10-CM | POA: Diagnosis not present

## 2019-10-29 MED ORDER — ALBUTEROL SULFATE HFA 108 (90 BASE) MCG/ACT IN AERS: 2.0000 | INHALATION_SPRAY | RESPIRATORY_TRACT | 11 refills | Status: AC | PRN

## 2019-10-29 MED ORDER — AZELASTINE HCL 0.1 % NA SOLN: 1.0000 | Freq: Two times a day (BID) | NASAL | 12 refills | Status: AC

## 2019-10-29 NOTE — Progress Notes (Signed)
Synopsis: Referred in November 2020 for COPD by Marco Collie, MD  Subjective:   PATIENT ID: Tracy Clark GENDER: female DOB: 07/01/1941, MRN: [id number]  Chief Complaint  Patient presents with  . Pulmonary Consult    Ms. Padberg is a 78 year old woman with a history of Alzheimer's, COPD, and chronic hypoxic respiratory failure who presents with her son for evaluation of COPD.  She was diagnosed with COPD about 15 years ago, which is when she quit smoking.  She smoked 2 packs/day for 50 years.  She has been maintained on Advair and Spiriva.  She is concerned that her dyspnea has been worsening over time.  She has an occasional cough with yellow sputum.  She wheezes frequently when she is coughing.  She is severely limited in her activity; she can walk about 50 feet but then has to sit.  Her husband describes that they still have her participate in her self-care with taking frequent breaks.  She is barely able to walk when out of her house.  She uses her albuterol or duo nebs about twice per day for shortness of breath, and does notice an improvement in her symptoms.  She had an exacerbation that required an ED visit in August 2020.  Her son describes that prednisone did not improve her symptoms, but cleaning her room and changing her oxygen hose helped.  She has been on 2 L of oxygen for about 7 years.  She has a portable oxygen concentrator for when she is out of the house.  She denies a history of allergies or childhood asthma.  Her father had COPD.  She has chronic leg edema that is unchanged.  She has not yet had her seasonal flu vaccine, but thinks she has previously had her pneumonia vaccines with her PCP.    Past Medical History:  Diagnosis Date  . Anemia   . CHF (congestive heart failure) ([place])   . Chronic ischemic heart disease   . CKD (chronic kidney disease), stage III   . COPD (chronic obstructive pulmonary disease) ([place])   . Dementia ([place])   . Dementia ([place])   . GERD  (gastroesophageal reflux disease)   . History of gastrointestinal bleeding   . Hx of peptic ulcer   . Hyperlipidemia   . Hypertension   . Obesity   . Osteoarthritis   . RLS (restless legs syndrome)   . Vitamin B 12 deficiency      Family History  Problem Relation Age of Onset  . Heart disease Mother   . Alzheimer's disease Mother   . COPD Father   . Prostate cancer Father   . Lung cancer Sister   . Alzheimer's disease Sister   . Alzheimer's disease Brother   . Head & neck cancer Brother   . Alzheimer's disease Sister      Past Surgical History:  Procedure Laterality Date  . APPENDECTOMY    . Catherization    . CHOLECYSTECTOMY    . COLONOSCOPY  05/09/2017   Mild sigmoid diverticulosis.   Marland Kitchen ESOPHAGOGASTRODUODENOSCOPY  05/07/2017   Large hiatal hernia. Incidental gastric polyps. Mild gastritis. No evidence of upper GI bleeding.   Marland Kitchen LEG SURGERY    . SHOULDER SURGERY      Social History   Socioeconomic History  . Marital status: Widowed    Spouse name: Not on file  . Number of children: Not on file  . Years of education: Not on file  . Highest education level: Not on  file  Occupational History  . Not on file  Social Needs  . Financial resource strain: Not on file  . Food insecurity    Worry: Not on file    Inability: Not on file  . Transportation needs    Medical: Not on file    Non-medical: Not on file  Tobacco Use  . Smoking status: Former Smoker    Packs/day: 2.00    Years: 50.00    Pack years: 100.00    Types: Cigarettes    Quit date: 10/25/2003    Years since quitting: 16.0  . Smokeless tobacco: Never Used  Substance and Sexual Activity  . Alcohol use: Not Currently  . Drug use: Never  . Sexual activity: Not on file  Lifestyle  . Physical activity    Days per week: Not on file    Minutes per session: Not on file  . Stress: Not on file  Relationships  . Social Herbalist on phone: Not on file    Gets together: Not on file     Attends religious service: Not on file    Active member of club or organization: Not on file    Attends meetings of clubs or organizations: Not on file    Relationship status: Not on file  . Intimate partner violence    Fear of current or ex partner: Not on file    Emotionally abused: Not on file    Physically abused: Not on file    Forced sexual activity: Not on file  Other Topics Concern  . Not on file  Social History Narrative  . Not on file     Allergies  Allergen Reactions  . Codeine Hives  . Penicillins Hives      There is no immunization history on file for this patient.  Outpatient Medications Prior to Visit  Medication Sig Dispense Refill  . amitriptyline (ELAVIL) 50 MG tablet Take 100 mg by mouth at bedtime.    . clopidogrel (PLAVIX) 75 MG tablet Take 75 mg by mouth daily.    Marland Kitchen donepezil (ARICEPT) 10 MG tablet Take 10 mg by mouth at bedtime.    Marland Kitchen doxazosin (CARDURA) 1 MG tablet Take 1 mg by mouth at bedtime.    . Fluticasone-Salmeterol (ADVAIR) 250-50 MCG/DOSE AEPB Inhale 1 puff into the lungs 2 (two) times daily.    . furosemide (LASIX) 40 MG tablet Take 40 mg by mouth daily.    . isosorbide mononitrate (ISMO,MONOKET) 20 MG tablet Take 20 mg by mouth 2 (two) times daily.    Marland Kitchen OLANZapine (ZYPREXA) 5 MG tablet Take 5 mg by mouth every evening.    . pantoprazole (PROTONIX) 40 MG tablet Take 40 mg by mouth 2 (two) times daily.    . ropinirole (REQUIP) 5 MG tablet Take 5 mg by mouth 3 (three) times daily as needed.    . simvastatin (ZOCOR) 40 MG tablet Take 40 mg by mouth daily.    Marland Kitchen SPIRIVA HANDIHALER 18 MCG inhalation capsule Take 1 capsule by mouth daily.    Marland Kitchen venlafaxine XR (EFFEXOR-XR) 75 MG 24 hr capsule Take 75 mg by mouth every morning.     No facility-administered medications prior to visit.     Review of Systems  Constitutional: Negative for chills, fever and malaise/fatigue.  HENT: Negative for congestion and sore throat.        Rhinorrhea  Eyes:  Negative.   Respiratory: Positive for cough, sputum production, shortness of breath and  wheezing.   Cardiovascular: Positive for leg swelling. Negative for chest pain.  Gastrointestinal: Negative for blood in stool, heartburn, nausea and vomiting.  Genitourinary: Negative.   Musculoskeletal: Negative.   Skin: Negative for rash.  Neurological: Positive for weakness.       Balance trouble     Objective:   Vitals:   10/29/19 1352  BP: (!) 124/56  Pulse: 78  Temp: (!) 97 F (36.1 C)  TempSrc: Oral  SpO2: 96%  Weight: 196 lb 6.4 oz (89.1 kg)  Height: 5\' 4"  (1.626 m)   96% on  2 LPM   BMI Readings from Last 3 Encounters:  10/29/19 33.71 kg/m  10/24/18 35.03 kg/m   Wt Readings from Last 3 Encounters:  10/29/19 196 lb 6.4 oz (89.1 kg)  10/24/18 190 lb (86.2 kg)    Physical Exam Vitals signs reviewed.  Constitutional:      General: She is not in acute distress.    Appearance: Normal appearance. She is obese. She is not ill-appearing.  HENT:     Head: Normocephalic and atraumatic.     Nose:     Comments: Deferred due to masking requirement.    Mouth/Throat:     Comments: Deferred due to masking requirement. Eyes:     General: No scleral icterus. Neck:     Musculoskeletal: Neck supple.  Cardiovascular:     Rate and Rhythm: Normal rate and regular rhythm.     Heart sounds: No murmur.  Pulmonary:     Comments: Breathing comfortably on 2 L nasal cannula.  No conversational dyspnea.  Decreased breath sounds bilaterally but clear to auscultation. Abdominal:     General: There is no distension.     Palpations: Abdomen is soft.     Tenderness: There is no abdominal tenderness.  Musculoskeletal:     Comments: Symmetric pitting edema to the knees.  Lymphadenopathy:     Cervical: No cervical adenopathy.  Skin:    General: Skin is warm and dry.     Coloration: Skin is pale.     Findings: No rash.  Neurological:     Mental Status: She is alert.     Comments: Sitting in  a wheelchair.  No apparent memory deficits-able to answer questions appropriately.  Psychiatric:        Mood and Affect: Mood normal.        Behavior: Behavior normal.      CBC    Component Value Date/Time   WBC 5.1 10/24/2018 1113   RBC 3.91 10/24/2018 1113   HGB 10.3 (L) 10/24/2018 1113   HCT 32.4 (L) 10/24/2018 1113   PLT 254.0 10/24/2018 1113   MCV 82.8 10/24/2018 1113   MCHC 32.0 10/24/2018 1113   RDW 17.6 (H) 10/24/2018 1113   LYMPHSABS 1.2 10/24/2018 1113   MONOABS 0.5 10/24/2018 1113   EOSABS 0.1 10/24/2018 1113   BASOSABS 0.0 10/24/2018 1113    CHEMISTRY No results for input(s): NA, K, CL, CO2, GLUCOSE, BUN, CREATININE, CALCIUM, MG, PHOS in the last 168 hours. CrCl cannot be calculated (Patient's most recent lab result is older than the maximum 21 days allowed.).    Chest Imaging- films reviewed: CTA abdomen pelvis 11/01/2018, lung films reviewed- 42mm RLL lateral pulmonary nodule.  Large hiatal hernia.  Pulmonary Functions Testing Results: No flowsheet data found.       Assessment & Plan:     ICD-10-CM   1. Pulmonary nodule  R91.1 CT Chest Wo Contrast  2. Abnormal findings on  diagnostic imaging of lung  R91.8 CT Chest Wo Contrast  3. SOB (shortness of breath)  R06.02   4. Need for immunization against influenza  Z23 Flu Vaccine QUAD High Dose(Fluad)   Shortness of breath, suspect COPD -Continue LABA, LABA, ICS -Continue SABA as needed -Flu shot today -Continue mask wearing, handwashing, social distancing per COVID-19 precautions -Continue regular physical activity to maintain exercise tolerance -Azelastine nasal spray for rhinorrhea -Will defer PFTs as I suspect she would have difficulty completing the maneuvers.  4 mm RLL pulmonary nodule, history of tobacco abuse -CT chest for 1 year follow-up   See in 3 months.   Current Outpatient Medications:  .  amitriptyline (ELAVIL) 50 MG tablet, Take 100 mg by mouth at bedtime., Disp: , Rfl:  .   clopidogrel (PLAVIX) 75 MG tablet, Take 75 mg by mouth daily., Disp: , Rfl:  .  donepezil (ARICEPT) 10 MG tablet, Take 10 mg by mouth at bedtime., Disp: , Rfl:  .  doxazosin (CARDURA) 1 MG tablet, Take 1 mg by mouth at bedtime., Disp: , Rfl:  .  Fluticasone-Salmeterol (ADVAIR) 250-50 MCG/DOSE AEPB, Inhale 1 puff into the lungs 2 (two) times daily., Disp: , Rfl:  .  furosemide (LASIX) 40 MG tablet, Take 40 mg by mouth daily., Disp: , Rfl:  .  isosorbide mononitrate (ISMO,MONOKET) 20 MG tablet, Take 20 mg by mouth 2 (two) times daily., Disp: , Rfl:  .  OLANZapine (ZYPREXA) 5 MG tablet, Take 5 mg by mouth every evening., Disp: , Rfl:  .  pantoprazole (PROTONIX) 40 MG tablet, Take 40 mg by mouth 2 (two) times daily., Disp: , Rfl:  .  ropinirole (REQUIP) 5 MG tablet, Take 5 mg by mouth 3 (three) times daily as needed., Disp: , Rfl:  .  simvastatin (ZOCOR) 40 MG tablet, Take 40 mg by mouth daily., Disp: , Rfl:  .  SPIRIVA HANDIHALER 18 MCG inhalation capsule, Take 1 capsule by mouth daily., Disp: , Rfl:  .  venlafaxine XR (EFFEXOR-XR) 75 MG 24 hr capsule, Take 75 mg by mouth every morning., Disp: , Rfl:  .  albuterol (VENTOLIN HFA) 108 (90 Base) MCG/ACT inhaler, Inhale 2 puffs into the lungs every 4 (four) hours as needed for wheezing or shortness of breath., Disp: 6.7 g, Rfl: 11 .  azelastine (ASTELIN) 0.1 % nasal spray, Place 1 spray into both nostrils 2 (two) times daily. Use in each nostril as directed, Disp: 30 mL, Rfl: 12   Julian Hy, DO [facility] 10/29/2019 2:20 PM

## 2019-10-29 NOTE — Patient Instructions (Addendum)
Thank you for visiting Dr. Carlis Abbott at [facility]. We recommend the following: Orders Placed This Encounter  Procedures  . CT Chest Wo Contrast   Orders Placed This Encounter  Procedures  . CT Chest Wo Contrast    Within 1 month    Standing Status:   Future    Standing Expiration Date:   12/28/2020    Order Specific Question:   ** REASON FOR EXAM (FREE TEXT)    Answer:   RLL nodule on abd CT, former smoker    Order Specific Question:   Preferred imaging location?    Answer:   [facility]    Order Specific Question:   Radiology Contrast Protocol - do NOT remove file path    Answer:   [email or web address]    Meds ordered this encounter  Medications  . albuterol (VENTOLIN HFA) 108 (90 Base) MCG/ACT inhaler    Sig: Inhale 2 puffs into the lungs every 4 (four) hours as needed for wheezing or shortness of breath.    Dispense:  6.7 g    Refill:  11  . azelastine (ASTELIN) 0.1 % nasal spray    Sig: Place 1 spray into both nostrils 2 (two) times daily. Use in each nostril as directed    Dispense:  30 mL    Refill:  12    Return in about 3 months (around 01/29/2020).    Please do your part to reduce the spread of COVID-19.

## 2019-11-05 DIAGNOSIS — Z9981 Dependence on supplemental oxygen: Secondary | ICD-10-CM | POA: Diagnosis not present

## 2019-11-05 DIAGNOSIS — J411 Mucopurulent chronic bronchitis: Secondary | ICD-10-CM | POA: Diagnosis not present

## 2019-11-05 DIAGNOSIS — J449 Chronic obstructive pulmonary disease, unspecified: Secondary | ICD-10-CM | POA: Diagnosis not present

## 2019-11-05 DIAGNOSIS — J9601 Acute respiratory failure with hypoxia: Secondary | ICD-10-CM | POA: Diagnosis not present

## 2019-11-07 DIAGNOSIS — G3183 Dementia with Lewy bodies: Secondary | ICD-10-CM | POA: Diagnosis not present

## 2019-11-07 DIAGNOSIS — R159 Full incontinence of feces: Secondary | ICD-10-CM | POA: Diagnosis not present

## 2019-11-07 DIAGNOSIS — N39490 Overflow incontinence: Secondary | ICD-10-CM | POA: Diagnosis not present

## 2019-11-07 DIAGNOSIS — R3981 Functional urinary incontinence: Secondary | ICD-10-CM | POA: Diagnosis not present

## 2019-11-24 DIAGNOSIS — J449 Chronic obstructive pulmonary disease, unspecified: Secondary | ICD-10-CM | POA: Diagnosis not present

## 2019-11-24 DIAGNOSIS — I5020 Unspecified systolic (congestive) heart failure: Secondary | ICD-10-CM | POA: Diagnosis not present

## 2019-11-24 DIAGNOSIS — F0150 Vascular dementia without behavioral disturbance: Secondary | ICD-10-CM | POA: Diagnosis not present

## 2019-11-24 DIAGNOSIS — J9610 Chronic respiratory failure, unspecified whether with hypoxia or hypercapnia: Secondary | ICD-10-CM | POA: Diagnosis not present

## 2019-11-28 ENCOUNTER — Ambulatory Visit (HOSPITAL_COMMUNITY): Payer: Medicare HMO

## 2019-11-28 DIAGNOSIS — G2581 Restless legs syndrome: Secondary | ICD-10-CM | POA: Diagnosis not present

## 2019-11-28 DIAGNOSIS — Z6834 Body mass index (BMI) 34.0-34.9, adult: Secondary | ICD-10-CM | POA: Diagnosis not present

## 2019-11-28 DIAGNOSIS — I951 Orthostatic hypotension: Secondary | ICD-10-CM | POA: Diagnosis not present

## 2019-12-05 DIAGNOSIS — J411 Mucopurulent chronic bronchitis: Secondary | ICD-10-CM | POA: Diagnosis not present

## 2019-12-05 DIAGNOSIS — J449 Chronic obstructive pulmonary disease, unspecified: Secondary | ICD-10-CM | POA: Diagnosis not present

## 2019-12-05 DIAGNOSIS — Z9981 Dependence on supplemental oxygen: Secondary | ICD-10-CM | POA: Diagnosis not present

## 2019-12-05 DIAGNOSIS — J9601 Acute respiratory failure with hypoxia: Secondary | ICD-10-CM | POA: Diagnosis not present

## 2019-12-09 DIAGNOSIS — R42 Dizziness and giddiness: Secondary | ICD-10-CM | POA: Diagnosis not present

## 2019-12-09 DIAGNOSIS — D519 Vitamin B12 deficiency anemia, unspecified: Secondary | ICD-10-CM | POA: Diagnosis not present

## 2019-12-09 DIAGNOSIS — I129 Hypertensive chronic kidney disease with stage 1 through stage 4 chronic kidney disease, or unspecified chronic kidney disease: Secondary | ICD-10-CM | POA: Diagnosis not present

## 2019-12-09 DIAGNOSIS — D508 Other iron deficiency anemias: Secondary | ICD-10-CM | POA: Diagnosis not present

## 2019-12-09 DIAGNOSIS — I5020 Unspecified systolic (congestive) heart failure: Secondary | ICD-10-CM | POA: Diagnosis not present

## 2019-12-10 DIAGNOSIS — D649 Anemia, unspecified: Secondary | ICD-10-CM | POA: Diagnosis not present

## 2019-12-11 DIAGNOSIS — D649 Anemia, unspecified: Secondary | ICD-10-CM | POA: Diagnosis not present

## 2019-12-23 DIAGNOSIS — J449 Chronic obstructive pulmonary disease, unspecified: Secondary | ICD-10-CM | POA: Diagnosis not present

## 2019-12-23 DIAGNOSIS — Z20828 Contact with and (suspected) exposure to other viral communicable diseases: Secondary | ICD-10-CM | POA: Diagnosis not present

## 2019-12-23 DIAGNOSIS — E876 Hypokalemia: Secondary | ICD-10-CM | POA: Diagnosis not present

## 2019-12-23 DIAGNOSIS — D508 Other iron deficiency anemias: Secondary | ICD-10-CM | POA: Diagnosis not present

## 2019-12-23 DIAGNOSIS — E538 Deficiency of other specified B group vitamins: Secondary | ICD-10-CM | POA: Diagnosis not present

## 2019-12-24 DIAGNOSIS — I129 Hypertensive chronic kidney disease with stage 1 through stage 4 chronic kidney disease, or unspecified chronic kidney disease: Secondary | ICD-10-CM | POA: Diagnosis not present

## 2019-12-24 DIAGNOSIS — I5020 Unspecified systolic (congestive) heart failure: Secondary | ICD-10-CM | POA: Diagnosis not present

## 2019-12-24 DIAGNOSIS — I951 Orthostatic hypotension: Secondary | ICD-10-CM | POA: Diagnosis not present

## 2019-12-24 DIAGNOSIS — N182 Chronic kidney disease, stage 2 (mild): Secondary | ICD-10-CM | POA: Diagnosis not present

## 2019-12-30 DIAGNOSIS — R42 Dizziness and giddiness: Secondary | ICD-10-CM | POA: Diagnosis not present

## 2019-12-30 DIAGNOSIS — F0150 Vascular dementia without behavioral disturbance: Secondary | ICD-10-CM | POA: Diagnosis not present

## 2019-12-30 DIAGNOSIS — R82998 Other abnormal findings in urine: Secondary | ICD-10-CM | POA: Diagnosis not present

## 2019-12-30 DIAGNOSIS — D508 Other iron deficiency anemias: Secondary | ICD-10-CM | POA: Diagnosis not present

## 2019-12-30 DIAGNOSIS — I5020 Unspecified systolic (congestive) heart failure: Secondary | ICD-10-CM | POA: Diagnosis not present

## 2019-12-30 DIAGNOSIS — J9610 Chronic respiratory failure, unspecified whether with hypoxia or hypercapnia: Secondary | ICD-10-CM | POA: Diagnosis not present

## 2019-12-30 DIAGNOSIS — Z8719 Personal history of other diseases of the digestive system: Secondary | ICD-10-CM | POA: Diagnosis not present

## 2019-12-30 DIAGNOSIS — J449 Chronic obstructive pulmonary disease, unspecified: Secondary | ICD-10-CM | POA: Diagnosis not present

## 2019-12-31 DIAGNOSIS — I129 Hypertensive chronic kidney disease with stage 1 through stage 4 chronic kidney disease, or unspecified chronic kidney disease: Secondary | ICD-10-CM | POA: Diagnosis not present

## 2019-12-31 DIAGNOSIS — E46 Unspecified protein-calorie malnutrition: Secondary | ICD-10-CM | POA: Diagnosis not present

## 2019-12-31 DIAGNOSIS — R42 Dizziness and giddiness: Secondary | ICD-10-CM | POA: Diagnosis not present

## 2019-12-31 DIAGNOSIS — D508 Other iron deficiency anemias: Secondary | ICD-10-CM | POA: Diagnosis not present

## 2019-12-31 DIAGNOSIS — I5020 Unspecified systolic (congestive) heart failure: Secondary | ICD-10-CM | POA: Diagnosis not present

## 2019-12-31 DIAGNOSIS — D519 Vitamin B12 deficiency anemia, unspecified: Secondary | ICD-10-CM | POA: Diagnosis not present

## 2020-01-05 DIAGNOSIS — J449 Chronic obstructive pulmonary disease, unspecified: Secondary | ICD-10-CM | POA: Diagnosis not present

## 2020-01-05 DIAGNOSIS — J9601 Acute respiratory failure with hypoxia: Secondary | ICD-10-CM | POA: Diagnosis not present

## 2020-01-05 DIAGNOSIS — Z9981 Dependence on supplemental oxygen: Secondary | ICD-10-CM | POA: Diagnosis not present

## 2020-01-05 DIAGNOSIS — J411 Mucopurulent chronic bronchitis: Secondary | ICD-10-CM | POA: Diagnosis not present

## 2020-01-08 DIAGNOSIS — I5020 Unspecified systolic (congestive) heart failure: Secondary | ICD-10-CM | POA: Diagnosis not present

## 2020-01-08 DIAGNOSIS — I129 Hypertensive chronic kidney disease with stage 1 through stage 4 chronic kidney disease, or unspecified chronic kidney disease: Secondary | ICD-10-CM | POA: Diagnosis not present

## 2020-01-08 DIAGNOSIS — E785 Hyperlipidemia, unspecified: Secondary | ICD-10-CM | POA: Diagnosis not present

## 2020-01-12 DIAGNOSIS — N39490 Overflow incontinence: Secondary | ICD-10-CM | POA: Diagnosis not present

## 2020-01-12 DIAGNOSIS — R159 Full incontinence of feces: Secondary | ICD-10-CM | POA: Diagnosis not present

## 2020-01-12 DIAGNOSIS — R3981 Functional urinary incontinence: Secondary | ICD-10-CM | POA: Diagnosis not present

## 2020-01-12 DIAGNOSIS — G3183 Dementia with Lewy bodies: Secondary | ICD-10-CM | POA: Diagnosis not present

## 2020-01-22 DIAGNOSIS — D518 Other vitamin B12 deficiency anemias: Secondary | ICD-10-CM | POA: Diagnosis not present

## 2020-01-22 DIAGNOSIS — D508 Other iron deficiency anemias: Secondary | ICD-10-CM | POA: Diagnosis not present

## 2020-01-22 DIAGNOSIS — D519 Vitamin B12 deficiency anemia, unspecified: Secondary | ICD-10-CM | POA: Diagnosis not present

## 2020-01-23 DIAGNOSIS — E785 Hyperlipidemia, unspecified: Secondary | ICD-10-CM | POA: Diagnosis not present

## 2020-01-23 DIAGNOSIS — N182 Chronic kidney disease, stage 2 (mild): Secondary | ICD-10-CM | POA: Diagnosis not present

## 2020-01-23 DIAGNOSIS — I129 Hypertensive chronic kidney disease with stage 1 through stage 4 chronic kidney disease, or unspecified chronic kidney disease: Secondary | ICD-10-CM | POA: Diagnosis not present

## 2020-01-27 ENCOUNTER — Ambulatory Visit: Payer: Medicare HMO | Admitting: Critical Care Medicine

## 2020-02-05 DIAGNOSIS — J449 Chronic obstructive pulmonary disease, unspecified: Secondary | ICD-10-CM | POA: Diagnosis not present

## 2020-02-05 DIAGNOSIS — J411 Mucopurulent chronic bronchitis: Secondary | ICD-10-CM | POA: Diagnosis not present

## 2020-02-05 DIAGNOSIS — J9601 Acute respiratory failure with hypoxia: Secondary | ICD-10-CM | POA: Diagnosis not present

## 2020-02-05 DIAGNOSIS — Z9981 Dependence on supplemental oxygen: Secondary | ICD-10-CM | POA: Diagnosis not present

## 2020-02-11 ENCOUNTER — Ambulatory Visit: Payer: Medicare HMO | Admitting: Critical Care Medicine

## 2020-02-11 NOTE — Progress Notes (Deleted)
Synopsis: Referred in November 2020 for COPD by Marco Collie, MD.  Subjective:   PATIENT ID: Tracy Clark GENDER: female DOB: August 22, 1941, MRN: [id number]  No chief complaint on file.   HPI   Possible COPD-- no PFTs b/c unable to do maneuvers 2/2 dementia? Triple inhaled therapy Azelastine  OV 10/29/2019: Tracy Clark is a 79 year old woman with a history of Alzheimer's, COPD, and chronic hypoxic respiratory failure who presents with her son for evaluation of COPD.  She was diagnosed with COPD about 15 years ago, which is when she quit smoking.  She smoked 2 packs/day for 50 years.  She has been maintained on Advair and Spiriva.  She is concerned that her dyspnea has been worsening over time.  She has an occasional cough with yellow sputum.  She wheezes frequently when she is coughing.  She is severely limited in her activity; she can walk about 50 feet but then has to sit.  Her husband describes that they still have her participate in her self-care with taking frequent breaks.  She is barely able to walk when out of her house.  She uses her albuterol or duo nebs about twice per day for shortness of breath, and does notice an improvement in her symptoms.  She had an exacerbation that required an ED visit in August 2020.  Her son describes that prednisone did not improve her symptoms, but cleaning her room and changing her oxygen hose helped.  She has been on 2 L of oxygen for about 7 years.  She has a portable oxygen concentrator for when she is out of the house.  She denies a history of allergies or childhood asthma.  Her father had COPD.  She has chronic leg edema that is unchanged.  She has not yet had her seasonal flu vaccine, but thinks she has previously had her pneumonia vaccines with her PCP.   Past Medical History:  Diagnosis Date  . Anemia   . CHF (congestive heart failure) ([place])   . Chronic ischemic heart disease   . CKD (chronic kidney disease), stage III   . COPD (chronic  obstructive pulmonary disease) ([place])   . Dementia ([place])   . Dementia ([place])   . GERD (gastroesophageal reflux disease)   . History of gastrointestinal bleeding   . Hx of peptic ulcer   . Hyperlipidemia   . Hypertension   . Obesity   . Osteoarthritis   . RLS (restless legs syndrome)   . Vitamin B 12 deficiency      Family History  Problem Relation Age of Onset  . Heart disease Mother   . Alzheimer's disease Mother   . COPD Father   . Prostate cancer Father   . Lung cancer Sister   . Alzheimer's disease Sister   . Breast cancer Sister   . Alzheimer's disease Brother   . Head & neck cancer Brother   . Alzheimer's disease Sister      Past Surgical History:  Procedure Laterality Date  . APPENDECTOMY    . Catherization    . CHOLECYSTECTOMY    . COLONOSCOPY  05/09/2017   Mild sigmoid diverticulosis.   Marland Kitchen ESOPHAGOGASTRODUODENOSCOPY  05/07/2017   Large hiatal hernia. Incidental gastric polyps. Mild gastritis. No evidence of upper GI bleeding.   Marland Kitchen LEG SURGERY    . SHOULDER SURGERY      Social History   Socioeconomic History  . Marital status: Widowed    Spouse name: Not on file  . Number  of children: Not on file  . Years of education: Not on file  . Highest education level: Not on file  Occupational History  . Not on file  Tobacco Use  . Smoking status: Former Smoker    Packs/day: 2.00    Years: 50.00    Pack years: 100.00    Types: Cigarettes    Quit date: 10/25/2003    Years since quitting: 16.3  . Smokeless tobacco: Never Used  Substance and Sexual Activity  . Alcohol use: Not Currently  . Drug use: Never  . Sexual activity: Not on file  Other Topics Concern  . Not on file  Social History Narrative  . Not on file   Social Determinants of Health   Financial Resource Strain:   . Difficulty of Paying Living Expenses: Not on file  Food Insecurity:   . Worried About Charity fundraiser in the Last Year: Not on file  . Ran Out of Food in the Last Year: Not  on file  Transportation Needs:   . Lack of Transportation (Medical): Not on file  . Lack of Transportation (Non-Medical): Not on file  Physical Activity:   . Days of Exercise per Week: Not on file  . Minutes of Exercise per Session: Not on file  Stress:   . Feeling of Stress : Not on file  Social Connections:   . Frequency of Communication with Friends and Family: Not on file  . Frequency of Social Gatherings with Friends and Family: Not on file  . Attends Religious Services: Not on file  . Active Member of Clubs or Organizations: Not on file  . Attends Archivist Meetings: Not on file  . Marital Status: Not on file  Intimate Partner Violence:   . Fear of Current or Ex-Partner: Not on file  . Emotionally Abused: Not on file  . Physically Abused: Not on file  . Sexually Abused: Not on file     Allergies  Allergen Reactions  . Codeine Hives  . Penicillins Hives     Immunization History  Administered Date(s) Administered  . Fluad Quad(high Dose 65+) 10/29/2019  . Influenza-Unspecified 10/25/2018    Outpatient Medications Prior to Visit  Medication Sig Dispense Refill  . albuterol (VENTOLIN HFA) 108 (90 Base) MCG/ACT inhaler Inhale 2 puffs into the lungs every 4 (four) hours as needed for wheezing or shortness of breath. 6.7 g 11  . amitriptyline (ELAVIL) 50 MG tablet Take 100 mg by mouth at bedtime.    Marland Kitchen azelastine (ASTELIN) 0.1 % nasal spray Place 1 spray into both nostrils 2 (two) times daily. Use in each nostril as directed 30 mL 12  . clopidogrel (PLAVIX) 75 MG tablet Take 75 mg by mouth daily.    Marland Kitchen donepezil (ARICEPT) 10 MG tablet Take 10 mg by mouth at bedtime.    Marland Kitchen doxazosin (CARDURA) 1 MG tablet Take 1 mg by mouth at bedtime.    . Fluticasone-Salmeterol (ADVAIR) 250-50 MCG/DOSE AEPB Inhale 1 puff into the lungs 2 (two) times daily.    . furosemide (LASIX) 40 MG tablet Take 40 mg by mouth daily.    . isosorbide mononitrate (ISMO,MONOKET) 20 MG tablet Take 20  mg by mouth 2 (two) times daily.    Marland Kitchen OLANZapine (ZYPREXA) 5 MG tablet Take 5 mg by mouth every evening.    . pantoprazole (PROTONIX) 40 MG tablet Take 40 mg by mouth 2 (two) times daily.    . ropinirole (REQUIP) 5 MG tablet Take  5 mg by mouth 3 (three) times daily as needed.    . simvastatin (ZOCOR) 40 MG tablet Take 40 mg by mouth daily.    Marland Kitchen SPIRIVA HANDIHALER 18 MCG inhalation capsule Take 1 capsule by mouth daily.    Marland Kitchen venlafaxine XR (EFFEXOR-XR) 75 MG 24 hr capsule Take 75 mg by mouth every morning.     No facility-administered medications prior to visit.    Review of Systems  Constitutional: Negative for chills, fever and malaise/fatigue.  HENT: Negative for congestion and sore throat.        Rhinorrhea  Eyes: Negative.   Respiratory: Positive for cough, sputum production, shortness of breath and wheezing.   Cardiovascular: Positive for leg swelling. Negative for chest pain.  Gastrointestinal: Negative for blood in stool, heartburn, nausea and vomiting.  Genitourinary: Negative.   Musculoskeletal: Negative.   Skin: Negative for rash.  Neurological: Positive for weakness.       Balance trouble     Objective:   There were no vitals filed for this visit.   on  2 LPM   BMI Readings from Last 3 Encounters:  10/29/19 33.71 kg/m  10/24/18 35.03 kg/m   Wt Readings from Last 3 Encounters:  10/29/19 196 lb 6.4 oz (89.1 kg)  10/24/18 190 lb (86.2 kg)    Physical Exam   CBC    Component Value Date/Time   WBC 5.1 10/24/2018 1113   RBC 3.91 10/24/2018 1113   HGB 10.3 (L) 10/24/2018 1113   HCT 32.4 (L) 10/24/2018 1113   PLT 254.0 10/24/2018 1113   MCV 82.8 10/24/2018 1113   MCHC 32.0 10/24/2018 1113   RDW 17.6 (H) 10/24/2018 1113   LYMPHSABS 1.2 10/24/2018 1113   MONOABS 0.5 10/24/2018 1113   EOSABS 0.1 10/24/2018 1113   BASOSABS 0.0 10/24/2018 1113    CHEMISTRY No results for input(s): NA, K, CL, CO2, GLUCOSE, BUN, CREATININE, CALCIUM, MG, PHOS in the last 168  hours. CrCl cannot be calculated (Patient's most recent lab result is older than the maximum 21 days allowed.).    Chest Imaging- films reviewed: CTA abdomen pelvis 11/01/2018, lung films reviewed- 17mm RLL lateral pulmonary nodule.  Large hiatal hernia.  Pulmonary Functions Testing Results: No flowsheet data found.       Assessment & Plan:   No diagnosis found.   Shortness of breath, suspect COPD -Continue LABA, LABA, ICS -Continue SABA as needed -Flu shot today -Continue mask wearing, handwashing, social distancing per COVID-19 precautions -Continue regular physical activity to maintain exercise tolerance -Azelastine nasal spray for rhinorrhea -Will defer PFTs as I suspect she would have difficulty completing the maneuvers.  4 mm RLL pulmonary nodule, history of tobacco abuse -CT chest for 1 year follow-up   See in 3 months.   Current Outpatient Medications:  .  albuterol (VENTOLIN HFA) 108 (90 Base) MCG/ACT inhaler, Inhale 2 puffs into the lungs every 4 (four) hours as needed for wheezing or shortness of breath., Disp: 6.7 g, Rfl: 11 .  amitriptyline (ELAVIL) 50 MG tablet, Take 100 mg by mouth at bedtime., Disp: , Rfl:  .  azelastine (ASTELIN) 0.1 % nasal spray, Place 1 spray into both nostrils 2 (two) times daily. Use in each nostril as directed, Disp: 30 mL, Rfl: 12 .  clopidogrel (PLAVIX) 75 MG tablet, Take 75 mg by mouth daily., Disp: , Rfl:  .  donepezil (ARICEPT) 10 MG tablet, Take 10 mg by mouth at bedtime., Disp: , Rfl:  .  doxazosin (CARDURA) 1  MG tablet, Take 1 mg by mouth at bedtime., Disp: , Rfl:  .  Fluticasone-Salmeterol (ADVAIR) 250-50 MCG/DOSE AEPB, Inhale 1 puff into the lungs 2 (two) times daily., Disp: , Rfl:  .  furosemide (LASIX) 40 MG tablet, Take 40 mg by mouth daily., Disp: , Rfl:  .  isosorbide mononitrate (ISMO,MONOKET) 20 MG tablet, Take 20 mg by mouth 2 (two) times daily., Disp: , Rfl:  .  OLANZapine (ZYPREXA) 5 MG tablet, Take 5 mg by mouth  every evening., Disp: , Rfl:  .  pantoprazole (PROTONIX) 40 MG tablet, Take 40 mg by mouth 2 (two) times daily., Disp: , Rfl:  .  ropinirole (REQUIP) 5 MG tablet, Take 5 mg by mouth 3 (three) times daily as needed., Disp: , Rfl:  .  simvastatin (ZOCOR) 40 MG tablet, Take 40 mg by mouth daily., Disp: , Rfl:  .  SPIRIVA HANDIHALER 18 MCG inhalation capsule, Take 1 capsule by mouth daily., Disp: , Rfl:  .  venlafaxine XR (EFFEXOR-XR) 75 MG 24 hr capsule, Take 75 mg by mouth every morning., Disp: , Rfl:    Julian Hy, DO [facility] 02/11/2020 8:55 AM

## 2020-02-14 IMAGING — CT CT CTA ABD/PEL W/CM AND/OR W/O CM
2 of 9 series · 9 of 46 positions shown, 15 images · IV contrast (iopamidol)
Comparison: None.

CLINICAL DATA: Melena and occult GI bleed requiring prior
transfusions and IV iron.

EXAM:
CT ANGIOGRAPHY ABDOMEN AND PELVIS WITH CONTRAST
TECHNIQUE: Multidetector CT imaging of the abdomen and pelvis was performed
using the standard protocol during bolus administration of
intravenous contrast. Multiplanar reconstructed images and MIPs were
obtained and reviewed to evaluate the vascular anatomy.
CONTRAST:  100mL F0RN3N-HEP IOPAMIDOL (F0RN3N-HEP) INJECTION 76%

[Series 7: axial venous · axial · portal-venous · 0.96mm/px · z∈[-422,-77]mm · 7 of 93 slices shown, 12 images]
[im 12/93  soft-tissue]
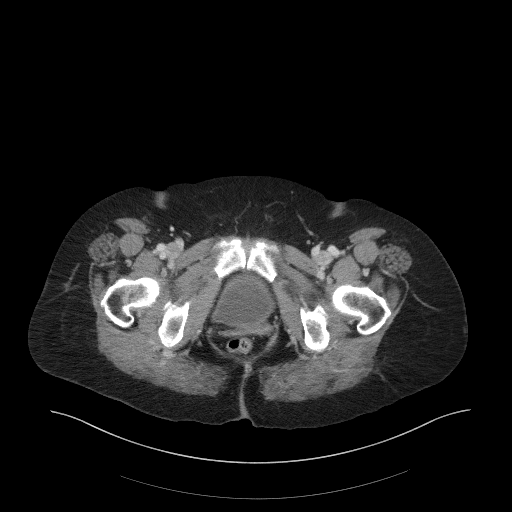
[im 12/93  bone]
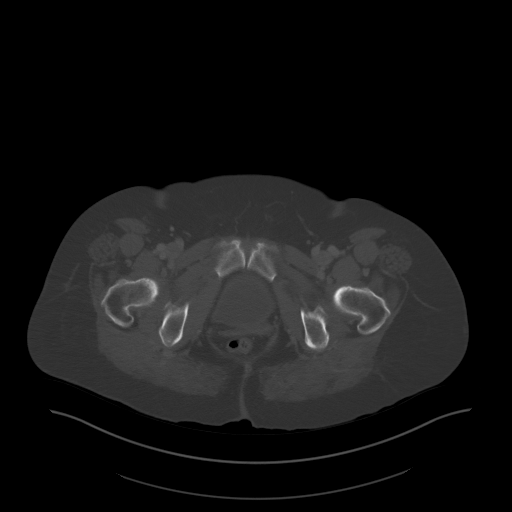
[im 24/93  soft-tissue]
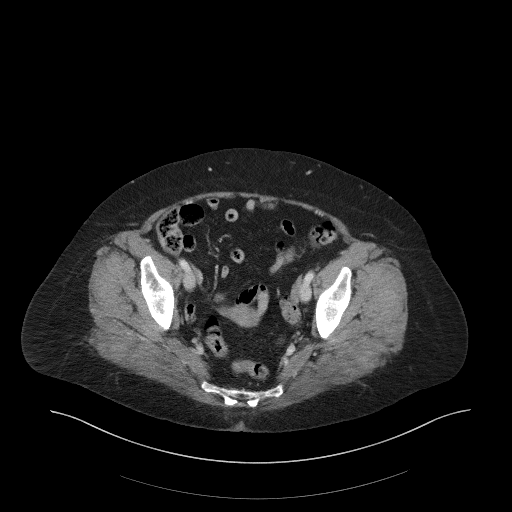
[im 35/93  soft-tissue]
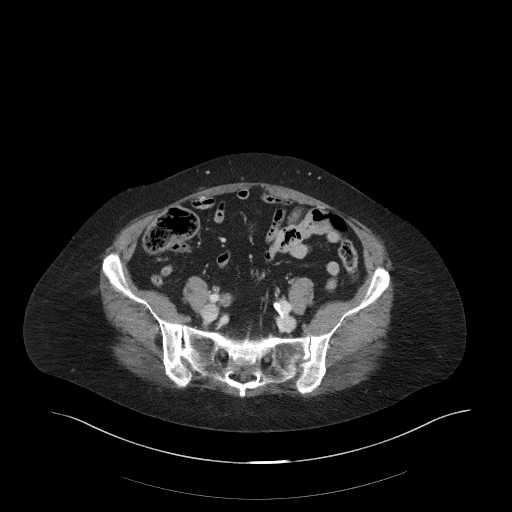
[im 47/93  soft-tissue]
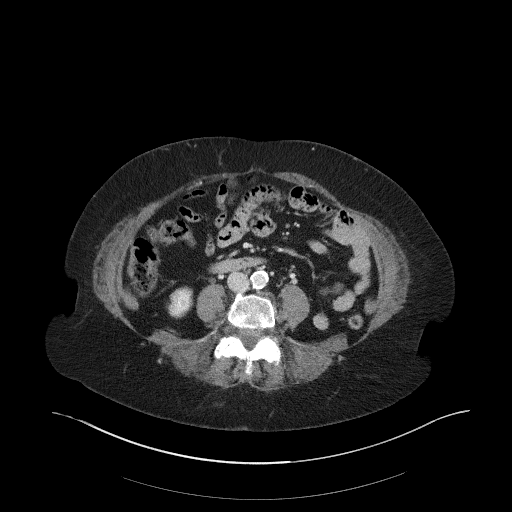
[im 47/93  lung]
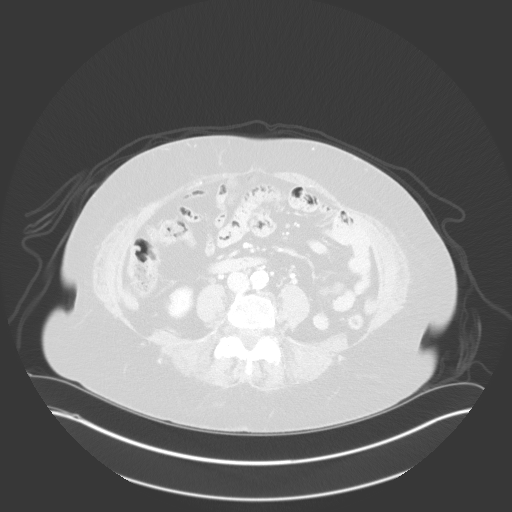
[im 58/93  soft-tissue]
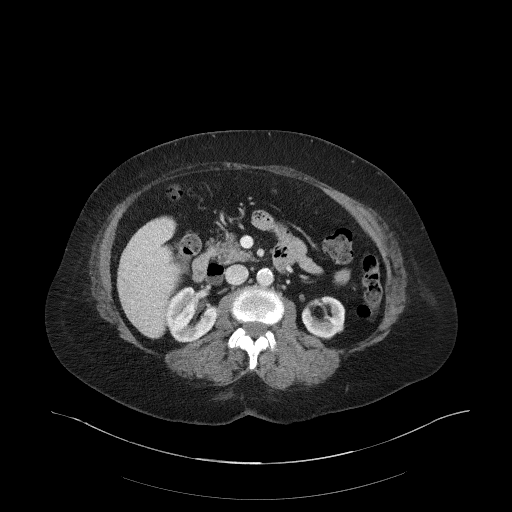
[im 58/93  lung]
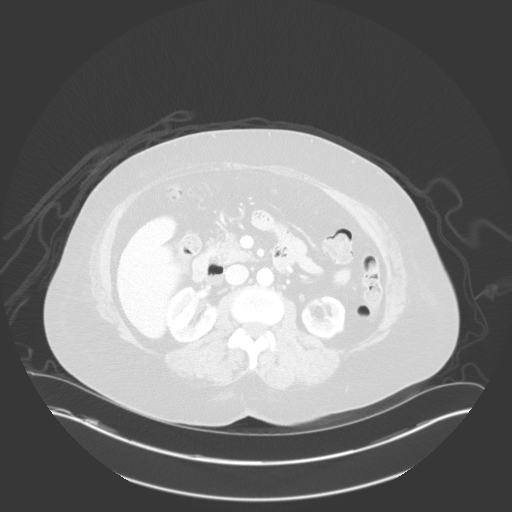
[im 70/93  soft-tissue]
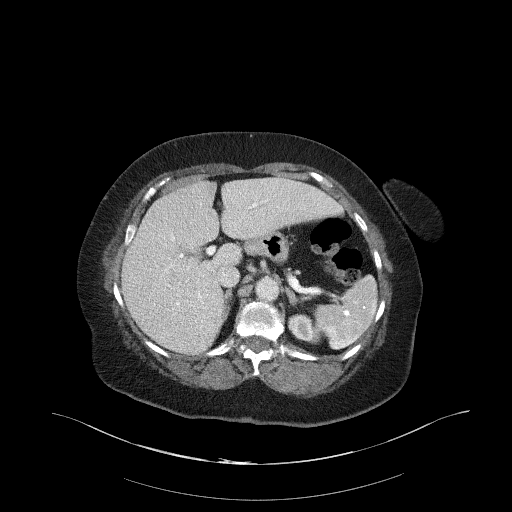
[im 70/93  lung]
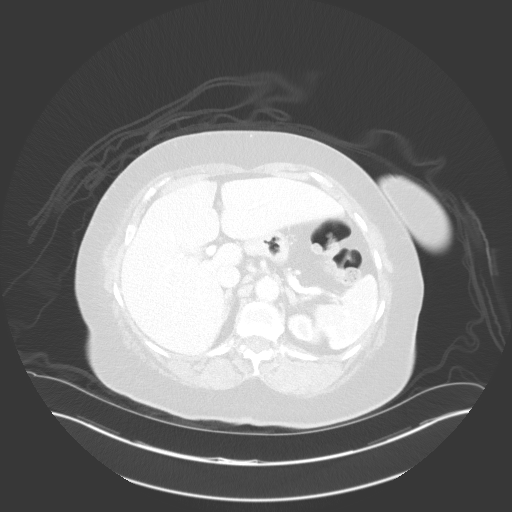
[im 81/93  soft-tissue]
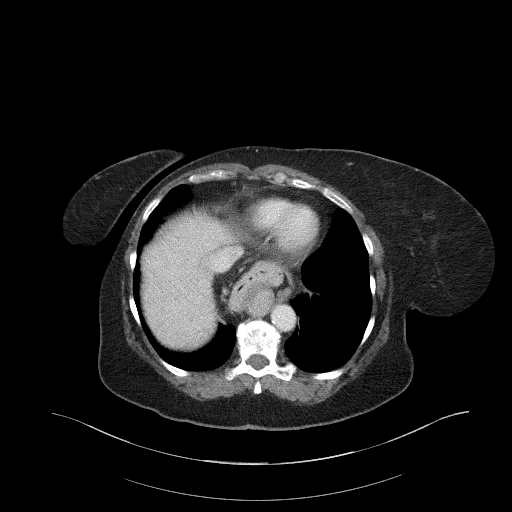
[im 81/93  lung]
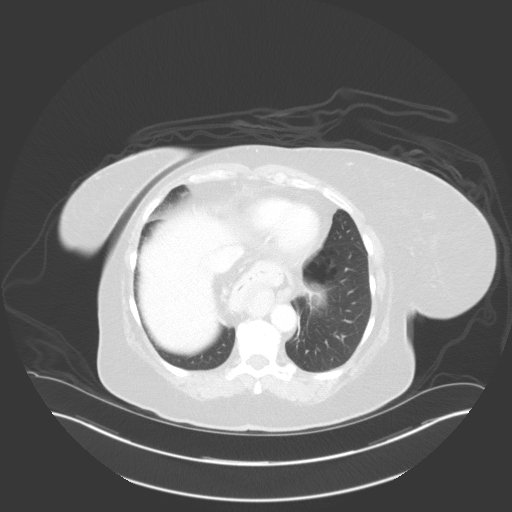

[Series 9: coronals · coronal · 0.79mm/px · 2 of 94 slices shown, 3 images]
[im 32/94  soft-tissue]
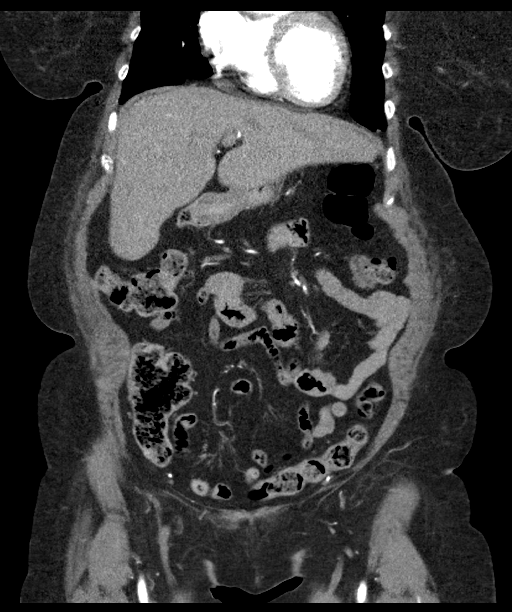
[im 32/94  bone]
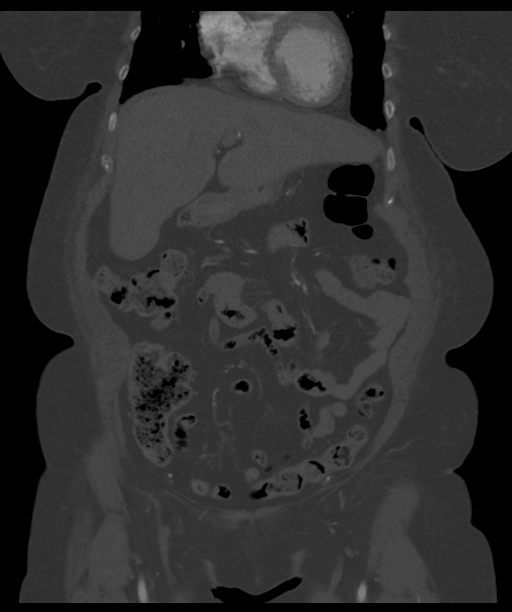
[im 63/94  soft-tissue]
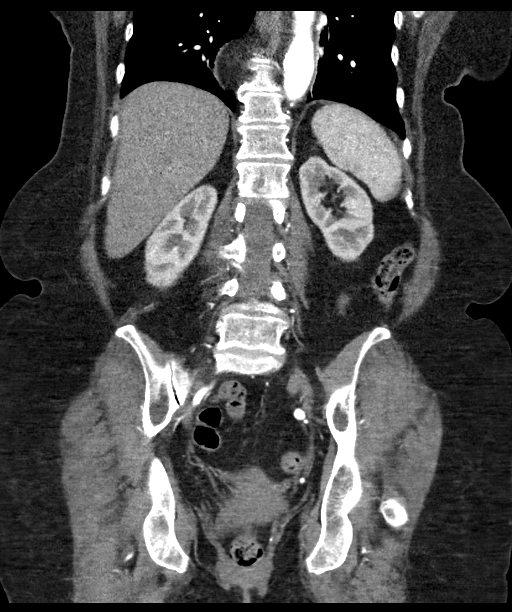

[9 of 46 positions shown; findings below may reference images not displayed]

FINDINGS: VASCULAR

Aorta: Scattered calcified plaque without evidence of aneurysm or
stenosis.

Celiac: There are separate origins a left gastric artery, a celiac
axis and a right hepatic artery off of the abdominal aorta. The
celiac axis supplies the splenic artery, gastroduodenal artery and
left hepatic artery. All vessels are normally patent.

SMA: Normally patent with no evidence of stenosis or distal branch
vessel abnormality.

Renals: Single right renal artery present. Calcified plaque causes
mild stenosis of the proximal artery without significant narrowing.
There are 3 separate renal arteries on the left demonstrating normal
patency.

IMA: Normally patent.

Arterial and venous phase imaging shows no evidence of vascular
malformations, active contrast extravasation into bowel or evidence
of vasculitis. No evidence to suggest portal hypertension or
portosystemic shunting.

Inflow: Normally patent bilateral iliac arteries.

Proximal Outflow: Normally patent bilateral common femoral arteries
and femoral bifurcations.

Veins: Venous phase imaging shows normal venous patency of systemic
veins and mesenteric veins. The portal vein is normally patent. No
varices are identified.

Review of the MIP images confirms the above findings.

NON-VASCULAR

Lower chest: No acute abnormality.

Hepatobiliary: No focal liver abnormality is seen. Status post
cholecystectomy. No biliary dilatation.

Pancreas: Unremarkable. No pancreatic ductal dilatation or
surrounding inflammatory changes.

Spleen: Normal size spleen. Several calcified granulomata are
present in the splenic parenchyma.

Adrenals/Urinary Tract: Adrenal glands are unremarkable. Kidneys are
normal, without renal calculi, focal lesion, or hydronephrosis.
Bladder is unremarkable.

Stomach/Bowel: There is a large hiatal hernia. No obvious gastric
inflammation or ulceration. There is a duodenal diverticulum along
the medial aspect of the second portion of the duodenum
demonstrating an air-fluid level. The diverticulum measures as much
as 3 cm in maximal diameter. The diverticulum does not appear
thickened or inflamed. Small bowel and colon are of normal caliber
and demonstrate no evidence of obstruction, ileus or inflammation.
No obvious bowel lesions are seen without oral contrast. No evidence
of intraluminal bleeding into the gastrointestinal tract by CTA.

Lymphatic: No enlarged lymph nodes identified.

Reproductive: Uterus and bilateral adnexa are unremarkable.

Other: No hernias identified.  No abnormal fluid collections.

Musculoskeletal: Mild anterolisthesis of L4 on L5 of approximately 6
mm with mild disc space narrowing. No bony lesions identified.
IMPRESSION: VASCULAR

1. No active bleeding identified into the gastrointestinal tract on
arterial or venous phases. No evidence of vascular abnormalities
that may be causes of gastrointestinal bleeding by CTA.
2. Incidental 5 separate mesenteric artery origins off of the
abdominal aorta, which is a normal variant, with separate origins of
the left gastric artery, celiac axis, right hepatic artery, SMA and
IMA.

NON-VASCULAR

1. Large hiatal hernia.
2. Duodenal diverticulum along the medial aspect of the second
portion of the duodenum measuring up to 3 cm and containing an
air-fluid level. The diverticulum does not appear obviously inflamed
or ulcerated by CT.
3. Lumbar degenerative disc disease with mild anterolisthesis of L4
on L5.

## 2020-02-22 DIAGNOSIS — N182 Chronic kidney disease, stage 2 (mild): Secondary | ICD-10-CM | POA: Diagnosis not present

## 2020-02-22 DIAGNOSIS — I129 Hypertensive chronic kidney disease with stage 1 through stage 4 chronic kidney disease, or unspecified chronic kidney disease: Secondary | ICD-10-CM | POA: Diagnosis not present

## 2020-02-22 DIAGNOSIS — E785 Hyperlipidemia, unspecified: Secondary | ICD-10-CM | POA: Diagnosis not present

## 2020-03-01 DIAGNOSIS — E46 Unspecified protein-calorie malnutrition: Secondary | ICD-10-CM | POA: Diagnosis not present

## 2020-03-01 DIAGNOSIS — J441 Chronic obstructive pulmonary disease with (acute) exacerbation: Secondary | ICD-10-CM | POA: Diagnosis not present

## 2020-03-04 DIAGNOSIS — Z9981 Dependence on supplemental oxygen: Secondary | ICD-10-CM | POA: Diagnosis not present

## 2020-03-04 DIAGNOSIS — J411 Mucopurulent chronic bronchitis: Secondary | ICD-10-CM | POA: Diagnosis not present

## 2020-03-04 DIAGNOSIS — J449 Chronic obstructive pulmonary disease, unspecified: Secondary | ICD-10-CM | POA: Diagnosis not present

## 2020-03-04 DIAGNOSIS — J9601 Acute respiratory failure with hypoxia: Secondary | ICD-10-CM | POA: Diagnosis not present

## 2020-03-22 DIAGNOSIS — I129 Hypertensive chronic kidney disease with stage 1 through stage 4 chronic kidney disease, or unspecified chronic kidney disease: Secondary | ICD-10-CM | POA: Diagnosis not present

## 2020-03-22 DIAGNOSIS — N390 Urinary tract infection, site not specified: Secondary | ICD-10-CM | POA: Diagnosis not present

## 2020-03-22 DIAGNOSIS — I5020 Unspecified systolic (congestive) heart failure: Secondary | ICD-10-CM | POA: Diagnosis not present

## 2020-03-22 DIAGNOSIS — D519 Vitamin B12 deficiency anemia, unspecified: Secondary | ICD-10-CM | POA: Diagnosis not present

## 2020-03-22 DIAGNOSIS — J449 Chronic obstructive pulmonary disease, unspecified: Secondary | ICD-10-CM | POA: Diagnosis not present

## 2020-03-22 DIAGNOSIS — D509 Iron deficiency anemia, unspecified: Secondary | ICD-10-CM | POA: Diagnosis not present

## 2020-03-24 DIAGNOSIS — N182 Chronic kidney disease, stage 2 (mild): Secondary | ICD-10-CM | POA: Diagnosis not present

## 2020-03-24 DIAGNOSIS — I129 Hypertensive chronic kidney disease with stage 1 through stage 4 chronic kidney disease, or unspecified chronic kidney disease: Secondary | ICD-10-CM | POA: Diagnosis not present

## 2020-03-24 DIAGNOSIS — J449 Chronic obstructive pulmonary disease, unspecified: Secondary | ICD-10-CM | POA: Diagnosis not present

## 2020-03-30 DIAGNOSIS — D509 Iron deficiency anemia, unspecified: Secondary | ICD-10-CM | POA: Diagnosis not present

## 2020-03-30 DIAGNOSIS — I5020 Unspecified systolic (congestive) heart failure: Secondary | ICD-10-CM | POA: Diagnosis not present

## 2020-03-30 DIAGNOSIS — Z6836 Body mass index (BMI) 36.0-36.9, adult: Secondary | ICD-10-CM | POA: Diagnosis not present

## 2020-03-31 DIAGNOSIS — N39490 Overflow incontinence: Secondary | ICD-10-CM | POA: Diagnosis not present

## 2020-03-31 DIAGNOSIS — G3183 Dementia with Lewy bodies: Secondary | ICD-10-CM | POA: Diagnosis not present

## 2020-03-31 DIAGNOSIS — R3981 Functional urinary incontinence: Secondary | ICD-10-CM | POA: Diagnosis not present

## 2020-03-31 DIAGNOSIS — R159 Full incontinence of feces: Secondary | ICD-10-CM | POA: Diagnosis not present

## 2020-04-01 DIAGNOSIS — I5020 Unspecified systolic (congestive) heart failure: Secondary | ICD-10-CM | POA: Diagnosis not present

## 2020-04-01 DIAGNOSIS — Z6836 Body mass index (BMI) 36.0-36.9, adult: Secondary | ICD-10-CM | POA: Diagnosis not present

## 2020-04-01 DIAGNOSIS — N183 Chronic kidney disease, stage 3 unspecified: Secondary | ICD-10-CM | POA: Diagnosis not present

## 2020-04-01 DIAGNOSIS — D509 Iron deficiency anemia, unspecified: Secondary | ICD-10-CM | POA: Diagnosis not present

## 2020-04-04 DIAGNOSIS — J9601 Acute respiratory failure with hypoxia: Secondary | ICD-10-CM | POA: Diagnosis not present

## 2020-04-04 DIAGNOSIS — J449 Chronic obstructive pulmonary disease, unspecified: Secondary | ICD-10-CM | POA: Diagnosis not present

## 2020-04-04 DIAGNOSIS — J411 Mucopurulent chronic bronchitis: Secondary | ICD-10-CM | POA: Diagnosis not present

## 2020-04-04 DIAGNOSIS — Z9981 Dependence on supplemental oxygen: Secondary | ICD-10-CM | POA: Diagnosis not present

## 2020-04-06 DIAGNOSIS — D508 Other iron deficiency anemias: Secondary | ICD-10-CM | POA: Diagnosis not present

## 2020-04-08 DIAGNOSIS — Z6834 Body mass index (BMI) 34.0-34.9, adult: Secondary | ICD-10-CM | POA: Diagnosis not present

## 2020-04-08 DIAGNOSIS — D509 Iron deficiency anemia, unspecified: Secondary | ICD-10-CM | POA: Diagnosis not present

## 2020-04-08 DIAGNOSIS — I5020 Unspecified systolic (congestive) heart failure: Secondary | ICD-10-CM | POA: Diagnosis not present

## 2020-04-13 DIAGNOSIS — D508 Other iron deficiency anemias: Secondary | ICD-10-CM | POA: Diagnosis not present

## 2020-04-15 DIAGNOSIS — I5020 Unspecified systolic (congestive) heart failure: Secondary | ICD-10-CM | POA: Diagnosis not present

## 2020-04-15 DIAGNOSIS — D509 Iron deficiency anemia, unspecified: Secondary | ICD-10-CM | POA: Diagnosis not present

## 2020-04-15 DIAGNOSIS — Z6834 Body mass index (BMI) 34.0-34.9, adult: Secondary | ICD-10-CM | POA: Diagnosis not present

## 2020-04-23 DIAGNOSIS — N183 Chronic kidney disease, stage 3 unspecified: Secondary | ICD-10-CM | POA: Diagnosis not present

## 2020-04-23 DIAGNOSIS — I5020 Unspecified systolic (congestive) heart failure: Secondary | ICD-10-CM | POA: Diagnosis not present

## 2020-04-23 DIAGNOSIS — Z6836 Body mass index (BMI) 36.0-36.9, adult: Secondary | ICD-10-CM | POA: Diagnosis not present

## 2020-04-23 DIAGNOSIS — I129 Hypertensive chronic kidney disease with stage 1 through stage 4 chronic kidney disease, or unspecified chronic kidney disease: Secondary | ICD-10-CM | POA: Diagnosis not present

## 2020-05-13 DIAGNOSIS — E785 Hyperlipidemia, unspecified: Secondary | ICD-10-CM | POA: Diagnosis not present

## 2020-05-13 DIAGNOSIS — D509 Iron deficiency anemia, unspecified: Secondary | ICD-10-CM | POA: Diagnosis not present

## 2020-05-20 DIAGNOSIS — E785 Hyperlipidemia, unspecified: Secondary | ICD-10-CM | POA: Diagnosis not present

## 2020-05-20 DIAGNOSIS — I129 Hypertensive chronic kidney disease with stage 1 through stage 4 chronic kidney disease, or unspecified chronic kidney disease: Secondary | ICD-10-CM | POA: Diagnosis not present

## 2020-05-20 DIAGNOSIS — D509 Iron deficiency anemia, unspecified: Secondary | ICD-10-CM | POA: Diagnosis not present

## 2020-05-20 DIAGNOSIS — N182 Chronic kidney disease, stage 2 (mild): Secondary | ICD-10-CM | POA: Diagnosis not present

## 2020-05-24 DIAGNOSIS — N182 Chronic kidney disease, stage 2 (mild): Secondary | ICD-10-CM | POA: Diagnosis not present

## 2020-05-24 DIAGNOSIS — I129 Hypertensive chronic kidney disease with stage 1 through stage 4 chronic kidney disease, or unspecified chronic kidney disease: Secondary | ICD-10-CM | POA: Diagnosis not present

## 2020-05-24 DIAGNOSIS — F0150 Vascular dementia without behavioral disturbance: Secondary | ICD-10-CM | POA: Diagnosis not present

## 2020-05-24 DIAGNOSIS — E785 Hyperlipidemia, unspecified: Secondary | ICD-10-CM | POA: Diagnosis not present

## 2020-06-14 DIAGNOSIS — J411 Mucopurulent chronic bronchitis: Secondary | ICD-10-CM | POA: Diagnosis not present

## 2020-06-23 DIAGNOSIS — I129 Hypertensive chronic kidney disease with stage 1 through stage 4 chronic kidney disease, or unspecified chronic kidney disease: Secondary | ICD-10-CM | POA: Diagnosis not present

## 2020-06-23 DIAGNOSIS — E785 Hyperlipidemia, unspecified: Secondary | ICD-10-CM | POA: Diagnosis not present

## 2020-06-23 DIAGNOSIS — N182 Chronic kidney disease, stage 2 (mild): Secondary | ICD-10-CM | POA: Diagnosis not present

## 2020-06-23 DIAGNOSIS — F0150 Vascular dementia without behavioral disturbance: Secondary | ICD-10-CM | POA: Diagnosis not present

## 2020-06-25 DIAGNOSIS — R3981 Functional urinary incontinence: Secondary | ICD-10-CM | POA: Diagnosis not present

## 2020-06-25 DIAGNOSIS — R159 Full incontinence of feces: Secondary | ICD-10-CM | POA: Diagnosis not present

## 2020-06-25 DIAGNOSIS — N39490 Overflow incontinence: Secondary | ICD-10-CM | POA: Diagnosis not present

## 2020-06-25 DIAGNOSIS — G3183 Dementia with Lewy bodies: Secondary | ICD-10-CM | POA: Diagnosis not present

## 2020-06-30 DIAGNOSIS — I5020 Unspecified systolic (congestive) heart failure: Secondary | ICD-10-CM | POA: Diagnosis not present

## 2020-06-30 DIAGNOSIS — R319 Hematuria, unspecified: Secondary | ICD-10-CM | POA: Diagnosis not present

## 2020-06-30 DIAGNOSIS — N183 Chronic kidney disease, stage 3 unspecified: Secondary | ICD-10-CM | POA: Diagnosis not present

## 2020-06-30 DIAGNOSIS — R531 Weakness: Secondary | ICD-10-CM | POA: Diagnosis not present

## 2020-06-30 DIAGNOSIS — D509 Iron deficiency anemia, unspecified: Secondary | ICD-10-CM | POA: Diagnosis not present

## 2020-06-30 DIAGNOSIS — W19XXXA Unspecified fall, initial encounter: Secondary | ICD-10-CM | POA: Diagnosis not present

## 2020-07-07 DIAGNOSIS — R269 Unspecified abnormalities of gait and mobility: Secondary | ICD-10-CM | POA: Diagnosis not present

## 2020-07-07 DIAGNOSIS — W19XXXA Unspecified fall, initial encounter: Secondary | ICD-10-CM | POA: Diagnosis not present

## 2020-07-07 DIAGNOSIS — R531 Weakness: Secondary | ICD-10-CM | POA: Diagnosis not present

## 2020-07-07 DIAGNOSIS — I5020 Unspecified systolic (congestive) heart failure: Secondary | ICD-10-CM | POA: Diagnosis not present

## 2020-07-08 DIAGNOSIS — Z1339 Encounter for screening examination for other mental health and behavioral disorders: Secondary | ICD-10-CM | POA: Diagnosis not present

## 2020-07-08 DIAGNOSIS — Z1389 Encounter for screening for other disorder: Secondary | ICD-10-CM | POA: Diagnosis not present

## 2020-07-08 DIAGNOSIS — Z136 Encounter for screening for cardiovascular disorders: Secondary | ICD-10-CM | POA: Diagnosis not present

## 2020-07-08 DIAGNOSIS — Z1331 Encounter for screening for depression: Secondary | ICD-10-CM | POA: Diagnosis not present

## 2020-07-08 DIAGNOSIS — Z9181 History of falling: Secondary | ICD-10-CM | POA: Diagnosis not present

## 2020-07-08 DIAGNOSIS — Z139 Encounter for screening, unspecified: Secondary | ICD-10-CM | POA: Diagnosis not present

## 2020-07-08 DIAGNOSIS — Z0000 Encounter for general adult medical examination without abnormal findings: Secondary | ICD-10-CM | POA: Diagnosis not present

## 2020-07-14 DIAGNOSIS — J411 Mucopurulent chronic bronchitis: Secondary | ICD-10-CM | POA: Diagnosis not present

## 2020-07-25 DIAGNOSIS — F0150 Vascular dementia without behavioral disturbance: Secondary | ICD-10-CM | POA: Diagnosis not present

## 2020-07-25 DIAGNOSIS — I129 Hypertensive chronic kidney disease with stage 1 through stage 4 chronic kidney disease, or unspecified chronic kidney disease: Secondary | ICD-10-CM | POA: Diagnosis not present

## 2020-07-25 DIAGNOSIS — N183 Chronic kidney disease, stage 3 unspecified: Secondary | ICD-10-CM | POA: Diagnosis not present

## 2020-07-25 DIAGNOSIS — E785 Hyperlipidemia, unspecified: Secondary | ICD-10-CM | POA: Diagnosis not present

## 2020-08-04 DIAGNOSIS — R531 Weakness: Secondary | ICD-10-CM | POA: Diagnosis not present

## 2020-08-04 DIAGNOSIS — M545 Low back pain: Secondary | ICD-10-CM | POA: Diagnosis not present

## 2020-08-04 DIAGNOSIS — R296 Repeated falls: Secondary | ICD-10-CM | POA: Diagnosis not present

## 2020-08-04 DIAGNOSIS — R269 Unspecified abnormalities of gait and mobility: Secondary | ICD-10-CM | POA: Diagnosis not present

## 2020-08-04 DIAGNOSIS — M25673 Stiffness of unspecified ankle, not elsewhere classified: Secondary | ICD-10-CM | POA: Diagnosis not present

## 2020-08-14 DIAGNOSIS — J411 Mucopurulent chronic bronchitis: Secondary | ICD-10-CM | POA: Diagnosis not present

## 2020-08-16 DIAGNOSIS — M25673 Stiffness of unspecified ankle, not elsewhere classified: Secondary | ICD-10-CM | POA: Diagnosis not present

## 2020-08-16 DIAGNOSIS — M545 Low back pain: Secondary | ICD-10-CM | POA: Diagnosis not present

## 2020-08-16 DIAGNOSIS — R296 Repeated falls: Secondary | ICD-10-CM | POA: Diagnosis not present

## 2020-08-16 DIAGNOSIS — R269 Unspecified abnormalities of gait and mobility: Secondary | ICD-10-CM | POA: Diagnosis not present

## 2020-08-16 DIAGNOSIS — R531 Weakness: Secondary | ICD-10-CM | POA: Diagnosis not present

## 2020-08-24 DIAGNOSIS — F0150 Vascular dementia without behavioral disturbance: Secondary | ICD-10-CM | POA: Diagnosis not present

## 2020-08-24 DIAGNOSIS — I129 Hypertensive chronic kidney disease with stage 1 through stage 4 chronic kidney disease, or unspecified chronic kidney disease: Secondary | ICD-10-CM | POA: Diagnosis not present

## 2020-08-24 DIAGNOSIS — E785 Hyperlipidemia, unspecified: Secondary | ICD-10-CM | POA: Diagnosis not present

## 2020-08-24 DIAGNOSIS — N183 Chronic kidney disease, stage 3 unspecified: Secondary | ICD-10-CM | POA: Diagnosis not present

## 2020-08-25 DIAGNOSIS — F0150 Vascular dementia without behavioral disturbance: Secondary | ICD-10-CM | POA: Diagnosis not present

## 2020-08-25 DIAGNOSIS — E785 Hyperlipidemia, unspecified: Secondary | ICD-10-CM | POA: Diagnosis not present

## 2020-08-25 DIAGNOSIS — N183 Chronic kidney disease, stage 3 unspecified: Secondary | ICD-10-CM | POA: Diagnosis not present

## 2020-08-25 DIAGNOSIS — I129 Hypertensive chronic kidney disease with stage 1 through stage 4 chronic kidney disease, or unspecified chronic kidney disease: Secondary | ICD-10-CM | POA: Diagnosis not present

## 2020-09-07 DIAGNOSIS — D509 Iron deficiency anemia, unspecified: Secondary | ICD-10-CM | POA: Diagnosis not present

## 2020-09-07 DIAGNOSIS — M25551 Pain in right hip: Secondary | ICD-10-CM | POA: Diagnosis not present

## 2020-09-07 DIAGNOSIS — Z6834 Body mass index (BMI) 34.0-34.9, adult: Secondary | ICD-10-CM | POA: Diagnosis not present

## 2020-09-07 DIAGNOSIS — I5020 Unspecified systolic (congestive) heart failure: Secondary | ICD-10-CM | POA: Diagnosis not present

## 2020-09-14 DIAGNOSIS — J411 Mucopurulent chronic bronchitis: Secondary | ICD-10-CM | POA: Diagnosis not present

## 2020-09-20 DIAGNOSIS — G3183 Dementia with Lewy bodies: Secondary | ICD-10-CM | POA: Diagnosis not present

## 2020-09-20 DIAGNOSIS — R159 Full incontinence of feces: Secondary | ICD-10-CM | POA: Diagnosis not present

## 2020-09-20 DIAGNOSIS — N39490 Overflow incontinence: Secondary | ICD-10-CM | POA: Diagnosis not present

## 2020-09-20 DIAGNOSIS — R3981 Functional urinary incontinence: Secondary | ICD-10-CM | POA: Diagnosis not present

## 2020-09-24 DIAGNOSIS — F0150 Vascular dementia without behavioral disturbance: Secondary | ICD-10-CM | POA: Diagnosis not present

## 2020-09-24 DIAGNOSIS — E785 Hyperlipidemia, unspecified: Secondary | ICD-10-CM | POA: Diagnosis not present

## 2020-09-24 DIAGNOSIS — I129 Hypertensive chronic kidney disease with stage 1 through stage 4 chronic kidney disease, or unspecified chronic kidney disease: Secondary | ICD-10-CM | POA: Diagnosis not present

## 2020-09-24 DIAGNOSIS — N183 Chronic kidney disease, stage 3 unspecified: Secondary | ICD-10-CM | POA: Diagnosis not present

## 2020-09-30 DIAGNOSIS — E785 Hyperlipidemia, unspecified: Secondary | ICD-10-CM | POA: Diagnosis not present

## 2020-09-30 DIAGNOSIS — I129 Hypertensive chronic kidney disease with stage 1 through stage 4 chronic kidney disease, or unspecified chronic kidney disease: Secondary | ICD-10-CM | POA: Diagnosis not present

## 2020-10-07 DIAGNOSIS — J449 Chronic obstructive pulmonary disease, unspecified: Secondary | ICD-10-CM | POA: Diagnosis not present

## 2020-10-07 DIAGNOSIS — Z23 Encounter for immunization: Secondary | ICD-10-CM | POA: Diagnosis not present

## 2020-10-07 DIAGNOSIS — I5020 Unspecified systolic (congestive) heart failure: Secondary | ICD-10-CM | POA: Diagnosis not present

## 2020-10-07 DIAGNOSIS — F0150 Vascular dementia without behavioral disturbance: Secondary | ICD-10-CM | POA: Diagnosis not present

## 2020-10-07 DIAGNOSIS — D509 Iron deficiency anemia, unspecified: Secondary | ICD-10-CM | POA: Diagnosis not present

## 2020-10-14 DIAGNOSIS — J411 Mucopurulent chronic bronchitis: Secondary | ICD-10-CM | POA: Diagnosis not present

## 2020-10-25 DIAGNOSIS — I5020 Unspecified systolic (congestive) heart failure: Secondary | ICD-10-CM | POA: Diagnosis not present

## 2020-10-25 DIAGNOSIS — J449 Chronic obstructive pulmonary disease, unspecified: Secondary | ICD-10-CM | POA: Diagnosis not present

## 2020-10-25 DIAGNOSIS — F0150 Vascular dementia without behavioral disturbance: Secondary | ICD-10-CM | POA: Diagnosis not present

## 2020-10-25 DIAGNOSIS — N1831 Chronic kidney disease, stage 3a: Secondary | ICD-10-CM | POA: Diagnosis not present

## 2020-11-10 DIAGNOSIS — N183 Chronic kidney disease, stage 3 unspecified: Secondary | ICD-10-CM | POA: Diagnosis not present

## 2020-11-14 DIAGNOSIS — J411 Mucopurulent chronic bronchitis: Secondary | ICD-10-CM | POA: Diagnosis not present

## 2020-11-16 DIAGNOSIS — Z6833 Body mass index (BMI) 33.0-33.9, adult: Secondary | ICD-10-CM | POA: Diagnosis not present

## 2020-11-16 DIAGNOSIS — I5020 Unspecified systolic (congestive) heart failure: Secondary | ICD-10-CM | POA: Diagnosis not present

## 2020-11-16 DIAGNOSIS — D509 Iron deficiency anemia, unspecified: Secondary | ICD-10-CM | POA: Diagnosis not present

## 2020-11-16 DIAGNOSIS — N183 Chronic kidney disease, stage 3 unspecified: Secondary | ICD-10-CM | POA: Diagnosis not present

## 2020-11-24 DIAGNOSIS — N1831 Chronic kidney disease, stage 3a: Secondary | ICD-10-CM | POA: Diagnosis not present

## 2020-11-24 DIAGNOSIS — J449 Chronic obstructive pulmonary disease, unspecified: Secondary | ICD-10-CM | POA: Diagnosis not present

## 2020-11-24 DIAGNOSIS — I5020 Unspecified systolic (congestive) heart failure: Secondary | ICD-10-CM | POA: Diagnosis not present

## 2020-11-29 DIAGNOSIS — N39490 Overflow incontinence: Secondary | ICD-10-CM | POA: Diagnosis not present

## 2020-11-29 DIAGNOSIS — R159 Full incontinence of feces: Secondary | ICD-10-CM | POA: Diagnosis not present

## 2020-11-29 DIAGNOSIS — R3981 Functional urinary incontinence: Secondary | ICD-10-CM | POA: Diagnosis not present

## 2020-11-29 DIAGNOSIS — G3183 Dementia with Lewy bodies: Secondary | ICD-10-CM | POA: Diagnosis not present

## 2020-12-02 DIAGNOSIS — I5020 Unspecified systolic (congestive) heart failure: Secondary | ICD-10-CM | POA: Diagnosis not present

## 2020-12-14 DIAGNOSIS — J411 Mucopurulent chronic bronchitis: Secondary | ICD-10-CM | POA: Diagnosis not present

## 2020-12-25 DIAGNOSIS — N1831 Chronic kidney disease, stage 3a: Secondary | ICD-10-CM | POA: Diagnosis not present

## 2020-12-25 DIAGNOSIS — I5020 Unspecified systolic (congestive) heart failure: Secondary | ICD-10-CM | POA: Diagnosis not present

## 2020-12-25 DIAGNOSIS — F0150 Vascular dementia without behavioral disturbance: Secondary | ICD-10-CM | POA: Diagnosis not present

## 2020-12-25 DIAGNOSIS — J449 Chronic obstructive pulmonary disease, unspecified: Secondary | ICD-10-CM | POA: Diagnosis not present

## 2021-01-14 DIAGNOSIS — J411 Mucopurulent chronic bronchitis: Secondary | ICD-10-CM | POA: Diagnosis not present

## 2021-01-25 DIAGNOSIS — F0150 Vascular dementia without behavioral disturbance: Secondary | ICD-10-CM | POA: Diagnosis not present

## 2021-01-25 DIAGNOSIS — I5020 Unspecified systolic (congestive) heart failure: Secondary | ICD-10-CM | POA: Diagnosis not present

## 2021-01-25 DIAGNOSIS — J449 Chronic obstructive pulmonary disease, unspecified: Secondary | ICD-10-CM | POA: Diagnosis not present

## 2021-01-25 DIAGNOSIS — N1831 Chronic kidney disease, stage 3a: Secondary | ICD-10-CM | POA: Diagnosis not present

## 2021-02-01 DIAGNOSIS — G3183 Dementia with Lewy bodies: Secondary | ICD-10-CM | POA: Diagnosis not present

## 2021-02-01 DIAGNOSIS — R159 Full incontinence of feces: Secondary | ICD-10-CM | POA: Diagnosis not present

## 2021-02-01 DIAGNOSIS — N39490 Overflow incontinence: Secondary | ICD-10-CM | POA: Diagnosis not present

## 2021-02-01 DIAGNOSIS — R3981 Functional urinary incontinence: Secondary | ICD-10-CM | POA: Diagnosis not present

## 2021-02-14 DIAGNOSIS — J411 Mucopurulent chronic bronchitis: Secondary | ICD-10-CM | POA: Diagnosis not present

## 2021-02-22 DIAGNOSIS — N1831 Chronic kidney disease, stage 3a: Secondary | ICD-10-CM | POA: Diagnosis not present

## 2021-02-22 DIAGNOSIS — I5020 Unspecified systolic (congestive) heart failure: Secondary | ICD-10-CM | POA: Diagnosis not present

## 2021-02-22 DIAGNOSIS — J449 Chronic obstructive pulmonary disease, unspecified: Secondary | ICD-10-CM | POA: Diagnosis not present

## 2021-02-22 DIAGNOSIS — F0150 Vascular dementia without behavioral disturbance: Secondary | ICD-10-CM | POA: Diagnosis not present

## 2021-03-14 DIAGNOSIS — J411 Mucopurulent chronic bronchitis: Secondary | ICD-10-CM | POA: Diagnosis not present

## 2021-03-25 DIAGNOSIS — J449 Chronic obstructive pulmonary disease, unspecified: Secondary | ICD-10-CM | POA: Diagnosis not present

## 2021-03-25 DIAGNOSIS — M810 Age-related osteoporosis without current pathological fracture: Secondary | ICD-10-CM | POA: Diagnosis not present

## 2021-03-25 DIAGNOSIS — I5022 Chronic systolic (congestive) heart failure: Secondary | ICD-10-CM | POA: Diagnosis not present

## 2021-03-31 DIAGNOSIS — E785 Hyperlipidemia, unspecified: Secondary | ICD-10-CM | POA: Diagnosis not present

## 2021-03-31 DIAGNOSIS — D509 Iron deficiency anemia, unspecified: Secondary | ICD-10-CM | POA: Diagnosis not present

## 2021-04-01 DIAGNOSIS — R3981 Functional urinary incontinence: Secondary | ICD-10-CM | POA: Diagnosis not present

## 2021-04-01 DIAGNOSIS — R159 Full incontinence of feces: Secondary | ICD-10-CM | POA: Diagnosis not present

## 2021-04-01 DIAGNOSIS — N39490 Overflow incontinence: Secondary | ICD-10-CM | POA: Diagnosis not present

## 2021-04-01 DIAGNOSIS — G3183 Dementia with Lewy bodies: Secondary | ICD-10-CM | POA: Diagnosis not present

## 2021-04-06 DIAGNOSIS — D509 Iron deficiency anemia, unspecified: Secondary | ICD-10-CM | POA: Diagnosis not present

## 2021-04-14 DIAGNOSIS — M25571 Pain in right ankle and joints of right foot: Secondary | ICD-10-CM | POA: Diagnosis not present

## 2021-04-14 DIAGNOSIS — J411 Mucopurulent chronic bronchitis: Secondary | ICD-10-CM | POA: Diagnosis not present

## 2021-04-14 DIAGNOSIS — J9610 Chronic respiratory failure, unspecified whether with hypoxia or hypercapnia: Secondary | ICD-10-CM | POA: Diagnosis not present

## 2021-04-14 DIAGNOSIS — J449 Chronic obstructive pulmonary disease, unspecified: Secondary | ICD-10-CM | POA: Diagnosis not present

## 2021-04-14 DIAGNOSIS — D509 Iron deficiency anemia, unspecified: Secondary | ICD-10-CM | POA: Diagnosis not present

## 2021-04-14 DIAGNOSIS — I5020 Unspecified systolic (congestive) heart failure: Secondary | ICD-10-CM | POA: Diagnosis not present

## 2021-04-22 DIAGNOSIS — D509 Iron deficiency anemia, unspecified: Secondary | ICD-10-CM | POA: Diagnosis not present

## 2021-04-24 DIAGNOSIS — M810 Age-related osteoporosis without current pathological fracture: Secondary | ICD-10-CM | POA: Diagnosis not present

## 2021-04-24 DIAGNOSIS — I5022 Chronic systolic (congestive) heart failure: Secondary | ICD-10-CM | POA: Diagnosis not present

## 2021-04-24 DIAGNOSIS — J449 Chronic obstructive pulmonary disease, unspecified: Secondary | ICD-10-CM | POA: Diagnosis not present

## 2021-05-14 DIAGNOSIS — J411 Mucopurulent chronic bronchitis: Secondary | ICD-10-CM | POA: Diagnosis not present

## 2021-05-25 DIAGNOSIS — I5022 Chronic systolic (congestive) heart failure: Secondary | ICD-10-CM | POA: Diagnosis not present

## 2021-05-25 DIAGNOSIS — J449 Chronic obstructive pulmonary disease, unspecified: Secondary | ICD-10-CM | POA: Diagnosis not present

## 2021-05-25 DIAGNOSIS — M810 Age-related osteoporosis without current pathological fracture: Secondary | ICD-10-CM | POA: Diagnosis not present

## 2021-06-10 DIAGNOSIS — N39490 Overflow incontinence: Secondary | ICD-10-CM | POA: Diagnosis not present

## 2021-06-10 DIAGNOSIS — R159 Full incontinence of feces: Secondary | ICD-10-CM | POA: Diagnosis not present

## 2021-06-10 DIAGNOSIS — R3981 Functional urinary incontinence: Secondary | ICD-10-CM | POA: Diagnosis not present

## 2021-06-10 DIAGNOSIS — G3183 Dementia with Lewy bodies: Secondary | ICD-10-CM | POA: Diagnosis not present

## 2021-06-14 DIAGNOSIS — J411 Mucopurulent chronic bronchitis: Secondary | ICD-10-CM | POA: Diagnosis not present

## 2021-06-24 DIAGNOSIS — M810 Age-related osteoporosis without current pathological fracture: Secondary | ICD-10-CM | POA: Diagnosis not present

## 2021-06-24 DIAGNOSIS — I5022 Chronic systolic (congestive) heart failure: Secondary | ICD-10-CM | POA: Diagnosis not present

## 2021-06-24 DIAGNOSIS — J449 Chronic obstructive pulmonary disease, unspecified: Secondary | ICD-10-CM | POA: Diagnosis not present

## 2021-07-14 DIAGNOSIS — J411 Mucopurulent chronic bronchitis: Secondary | ICD-10-CM | POA: Diagnosis not present

## 2021-07-25 DIAGNOSIS — M810 Age-related osteoporosis without current pathological fracture: Secondary | ICD-10-CM | POA: Diagnosis not present

## 2021-07-25 DIAGNOSIS — D509 Iron deficiency anemia, unspecified: Secondary | ICD-10-CM | POA: Diagnosis not present

## 2021-07-25 DIAGNOSIS — E785 Hyperlipidemia, unspecified: Secondary | ICD-10-CM | POA: Diagnosis not present

## 2021-07-25 DIAGNOSIS — J449 Chronic obstructive pulmonary disease, unspecified: Secondary | ICD-10-CM | POA: Diagnosis not present

## 2021-07-25 DIAGNOSIS — I5022 Chronic systolic (congestive) heart failure: Secondary | ICD-10-CM | POA: Diagnosis not present

## 2021-08-01 DIAGNOSIS — D692 Other nonthrombocytopenic purpura: Secondary | ICD-10-CM | POA: Diagnosis not present

## 2021-08-01 DIAGNOSIS — Z0000 Encounter for general adult medical examination without abnormal findings: Secondary | ICD-10-CM | POA: Diagnosis not present

## 2021-08-01 DIAGNOSIS — D509 Iron deficiency anemia, unspecified: Secondary | ICD-10-CM | POA: Diagnosis not present

## 2021-08-01 DIAGNOSIS — Z1339 Encounter for screening examination for other mental health and behavioral disorders: Secondary | ICD-10-CM | POA: Diagnosis not present

## 2021-08-01 DIAGNOSIS — Z1331 Encounter for screening for depression: Secondary | ICD-10-CM | POA: Diagnosis not present

## 2021-08-01 DIAGNOSIS — Z139 Encounter for screening, unspecified: Secondary | ICD-10-CM | POA: Diagnosis not present

## 2021-08-01 DIAGNOSIS — Z136 Encounter for screening for cardiovascular disorders: Secondary | ICD-10-CM | POA: Diagnosis not present

## 2021-08-01 DIAGNOSIS — N182 Chronic kidney disease, stage 2 (mild): Secondary | ICD-10-CM | POA: Diagnosis not present

## 2021-08-01 DIAGNOSIS — I129 Hypertensive chronic kidney disease with stage 1 through stage 4 chronic kidney disease, or unspecified chronic kidney disease: Secondary | ICD-10-CM | POA: Diagnosis not present

## 2021-08-01 DIAGNOSIS — E785 Hyperlipidemia, unspecified: Secondary | ICD-10-CM | POA: Diagnosis not present

## 2021-08-01 DIAGNOSIS — J9610 Chronic respiratory failure, unspecified whether with hypoxia or hypercapnia: Secondary | ICD-10-CM | POA: Diagnosis not present

## 2021-08-03 DIAGNOSIS — R3981 Functional urinary incontinence: Secondary | ICD-10-CM | POA: Diagnosis not present

## 2021-08-03 DIAGNOSIS — N39490 Overflow incontinence: Secondary | ICD-10-CM | POA: Diagnosis not present

## 2021-08-03 DIAGNOSIS — G3183 Dementia with Lewy bodies: Secondary | ICD-10-CM | POA: Diagnosis not present

## 2021-08-03 DIAGNOSIS — R159 Full incontinence of feces: Secondary | ICD-10-CM | POA: Diagnosis not present

## 2021-08-14 DIAGNOSIS — J411 Mucopurulent chronic bronchitis: Secondary | ICD-10-CM | POA: Diagnosis not present

## 2021-08-25 DIAGNOSIS — M810 Age-related osteoporosis without current pathological fracture: Secondary | ICD-10-CM | POA: Diagnosis not present

## 2021-08-25 DIAGNOSIS — I5022 Chronic systolic (congestive) heart failure: Secondary | ICD-10-CM | POA: Diagnosis not present

## 2021-08-25 DIAGNOSIS — J449 Chronic obstructive pulmonary disease, unspecified: Secondary | ICD-10-CM | POA: Diagnosis not present

## 2021-09-14 DIAGNOSIS — J411 Mucopurulent chronic bronchitis: Secondary | ICD-10-CM | POA: Diagnosis not present

## 2021-09-15 DIAGNOSIS — R159 Full incontinence of feces: Secondary | ICD-10-CM | POA: Diagnosis not present

## 2021-09-15 DIAGNOSIS — N39490 Overflow incontinence: Secondary | ICD-10-CM | POA: Diagnosis not present

## 2021-09-15 DIAGNOSIS — G3183 Dementia with Lewy bodies: Secondary | ICD-10-CM | POA: Diagnosis not present

## 2021-09-15 DIAGNOSIS — R3981 Functional urinary incontinence: Secondary | ICD-10-CM | POA: Diagnosis not present

## 2021-09-24 DIAGNOSIS — M810 Age-related osteoporosis without current pathological fracture: Secondary | ICD-10-CM | POA: Diagnosis not present

## 2021-09-24 DIAGNOSIS — I5022 Chronic systolic (congestive) heart failure: Secondary | ICD-10-CM | POA: Diagnosis not present

## 2021-09-24 DIAGNOSIS — J449 Chronic obstructive pulmonary disease, unspecified: Secondary | ICD-10-CM | POA: Diagnosis not present

## 2021-10-14 DIAGNOSIS — J411 Mucopurulent chronic bronchitis: Secondary | ICD-10-CM | POA: Diagnosis not present

## 2021-10-17 DIAGNOSIS — R82998 Other abnormal findings in urine: Secondary | ICD-10-CM | POA: Diagnosis not present

## 2021-10-17 DIAGNOSIS — I129 Hypertensive chronic kidney disease with stage 1 through stage 4 chronic kidney disease, or unspecified chronic kidney disease: Secondary | ICD-10-CM | POA: Diagnosis not present

## 2021-10-17 DIAGNOSIS — F0150 Vascular dementia without behavioral disturbance: Secondary | ICD-10-CM | POA: Diagnosis not present

## 2021-10-17 DIAGNOSIS — Z23 Encounter for immunization: Secondary | ICD-10-CM | POA: Diagnosis not present

## 2021-10-17 DIAGNOSIS — N182 Chronic kidney disease, stage 2 (mild): Secondary | ICD-10-CM | POA: Diagnosis not present

## 2021-10-21 DIAGNOSIS — G3183 Dementia with Lewy bodies: Secondary | ICD-10-CM | POA: Diagnosis not present

## 2021-10-21 DIAGNOSIS — N39490 Overflow incontinence: Secondary | ICD-10-CM | POA: Diagnosis not present

## 2021-10-21 DIAGNOSIS — R159 Full incontinence of feces: Secondary | ICD-10-CM | POA: Diagnosis not present

## 2021-10-21 DIAGNOSIS — R3981 Functional urinary incontinence: Secondary | ICD-10-CM | POA: Diagnosis not present

## 2021-10-25 DIAGNOSIS — M810 Age-related osteoporosis without current pathological fracture: Secondary | ICD-10-CM | POA: Diagnosis not present

## 2021-10-25 DIAGNOSIS — I5022 Chronic systolic (congestive) heart failure: Secondary | ICD-10-CM | POA: Diagnosis not present

## 2021-10-25 DIAGNOSIS — J449 Chronic obstructive pulmonary disease, unspecified: Secondary | ICD-10-CM | POA: Diagnosis not present

## 2021-10-31 DIAGNOSIS — I129 Hypertensive chronic kidney disease with stage 1 through stage 4 chronic kidney disease, or unspecified chronic kidney disease: Secondary | ICD-10-CM | POA: Diagnosis not present

## 2021-11-07 DIAGNOSIS — J449 Chronic obstructive pulmonary disease, unspecified: Secondary | ICD-10-CM | POA: Diagnosis not present

## 2021-11-07 DIAGNOSIS — Z20822 Contact with and (suspected) exposure to covid-19: Secondary | ICD-10-CM | POA: Diagnosis not present

## 2021-11-07 DIAGNOSIS — R059 Cough, unspecified: Secondary | ICD-10-CM | POA: Diagnosis not present

## 2021-11-07 DIAGNOSIS — Z6831 Body mass index (BMI) 31.0-31.9, adult: Secondary | ICD-10-CM | POA: Diagnosis not present

## 2021-11-14 DIAGNOSIS — J411 Mucopurulent chronic bronchitis: Secondary | ICD-10-CM | POA: Diagnosis not present

## 2021-11-24 DIAGNOSIS — J449 Chronic obstructive pulmonary disease, unspecified: Secondary | ICD-10-CM | POA: Diagnosis not present

## 2021-11-24 DIAGNOSIS — I129 Hypertensive chronic kidney disease with stage 1 through stage 4 chronic kidney disease, or unspecified chronic kidney disease: Secondary | ICD-10-CM | POA: Diagnosis not present

## 2021-12-14 DIAGNOSIS — J411 Mucopurulent chronic bronchitis: Secondary | ICD-10-CM | POA: Diagnosis not present

## 2021-12-21 DIAGNOSIS — R3981 Functional urinary incontinence: Secondary | ICD-10-CM | POA: Diagnosis not present

## 2021-12-21 DIAGNOSIS — N39490 Overflow incontinence: Secondary | ICD-10-CM | POA: Diagnosis not present

## 2021-12-21 DIAGNOSIS — G3183 Dementia with Lewy bodies: Secondary | ICD-10-CM | POA: Diagnosis not present

## 2021-12-21 DIAGNOSIS — R159 Full incontinence of feces: Secondary | ICD-10-CM | POA: Diagnosis not present

## 2021-12-25 DIAGNOSIS — I129 Hypertensive chronic kidney disease with stage 1 through stage 4 chronic kidney disease, or unspecified chronic kidney disease: Secondary | ICD-10-CM | POA: Diagnosis not present

## 2021-12-25 DIAGNOSIS — E785 Hyperlipidemia, unspecified: Secondary | ICD-10-CM | POA: Diagnosis not present

## 2021-12-25 DIAGNOSIS — J449 Chronic obstructive pulmonary disease, unspecified: Secondary | ICD-10-CM | POA: Diagnosis not present

## 2021-12-25 DIAGNOSIS — D509 Iron deficiency anemia, unspecified: Secondary | ICD-10-CM | POA: Diagnosis not present

## 2022-01-14 DIAGNOSIS — J411 Mucopurulent chronic bronchitis: Secondary | ICD-10-CM | POA: Diagnosis not present

## 2022-01-25 DIAGNOSIS — I129 Hypertensive chronic kidney disease with stage 1 through stage 4 chronic kidney disease, or unspecified chronic kidney disease: Secondary | ICD-10-CM | POA: Diagnosis not present

## 2022-01-25 DIAGNOSIS — J449 Chronic obstructive pulmonary disease, unspecified: Secondary | ICD-10-CM | POA: Diagnosis not present

## 2022-01-25 DIAGNOSIS — E785 Hyperlipidemia, unspecified: Secondary | ICD-10-CM | POA: Diagnosis not present

## 2022-01-25 DIAGNOSIS — R296 Repeated falls: Secondary | ICD-10-CM | POA: Diagnosis not present

## 2022-01-27 DIAGNOSIS — R159 Full incontinence of feces: Secondary | ICD-10-CM | POA: Diagnosis not present

## 2022-01-27 DIAGNOSIS — R3981 Functional urinary incontinence: Secondary | ICD-10-CM | POA: Diagnosis not present

## 2022-01-27 DIAGNOSIS — G3183 Dementia with Lewy bodies: Secondary | ICD-10-CM | POA: Diagnosis not present

## 2022-01-27 DIAGNOSIS — N39490 Overflow incontinence: Secondary | ICD-10-CM | POA: Diagnosis not present

## 2022-01-31 DIAGNOSIS — Z20822 Contact with and (suspected) exposure to covid-19: Secondary | ICD-10-CM | POA: Diagnosis not present

## 2022-01-31 DIAGNOSIS — R059 Cough, unspecified: Secondary | ICD-10-CM | POA: Diagnosis not present

## 2022-01-31 DIAGNOSIS — J020 Streptococcal pharyngitis: Secondary | ICD-10-CM | POA: Diagnosis not present

## 2022-02-01 DIAGNOSIS — I5020 Unspecified systolic (congestive) heart failure: Secondary | ICD-10-CM | POA: Diagnosis not present

## 2022-02-01 DIAGNOSIS — Z9181 History of falling: Secondary | ICD-10-CM | POA: Diagnosis not present

## 2022-02-14 DIAGNOSIS — J411 Mucopurulent chronic bronchitis: Secondary | ICD-10-CM | POA: Diagnosis not present

## 2022-02-22 DIAGNOSIS — E785 Hyperlipidemia, unspecified: Secondary | ICD-10-CM | POA: Diagnosis not present

## 2022-02-22 DIAGNOSIS — J449 Chronic obstructive pulmonary disease, unspecified: Secondary | ICD-10-CM | POA: Diagnosis not present

## 2022-02-22 DIAGNOSIS — I129 Hypertensive chronic kidney disease with stage 1 through stage 4 chronic kidney disease, or unspecified chronic kidney disease: Secondary | ICD-10-CM | POA: Diagnosis not present

## 2022-03-09 DIAGNOSIS — K922 Gastrointestinal hemorrhage, unspecified: Secondary | ICD-10-CM | POA: Diagnosis not present

## 2022-03-09 DIAGNOSIS — I509 Heart failure, unspecified: Secondary | ICD-10-CM | POA: Diagnosis not present

## 2022-03-09 DIAGNOSIS — Z6831 Body mass index (BMI) 31.0-31.9, adult: Secondary | ICD-10-CM | POA: Diagnosis not present

## 2022-03-09 DIAGNOSIS — D509 Iron deficiency anemia, unspecified: Secondary | ICD-10-CM | POA: Diagnosis not present

## 2022-03-09 DIAGNOSIS — D692 Other nonthrombocytopenic purpura: Secondary | ICD-10-CM | POA: Diagnosis not present

## 2022-03-14 DIAGNOSIS — J411 Mucopurulent chronic bronchitis: Secondary | ICD-10-CM | POA: Diagnosis not present

## 2022-03-15 DIAGNOSIS — K649 Unspecified hemorrhoids: Secondary | ICD-10-CM | POA: Diagnosis not present

## 2022-03-15 DIAGNOSIS — D509 Iron deficiency anemia, unspecified: Secondary | ICD-10-CM | POA: Diagnosis not present

## 2022-03-15 DIAGNOSIS — M069 Rheumatoid arthritis, unspecified: Secondary | ICD-10-CM | POA: Diagnosis not present

## 2022-03-15 DIAGNOSIS — K921 Melena: Secondary | ICD-10-CM | POA: Diagnosis not present

## 2022-03-25 DIAGNOSIS — J441 Chronic obstructive pulmonary disease with (acute) exacerbation: Secondary | ICD-10-CM | POA: Diagnosis not present

## 2022-03-25 DIAGNOSIS — I129 Hypertensive chronic kidney disease with stage 1 through stage 4 chronic kidney disease, or unspecified chronic kidney disease: Secondary | ICD-10-CM | POA: Diagnosis not present

## 2022-04-06 DIAGNOSIS — R159 Full incontinence of feces: Secondary | ICD-10-CM | POA: Diagnosis not present

## 2022-04-06 DIAGNOSIS — N39490 Overflow incontinence: Secondary | ICD-10-CM | POA: Diagnosis not present

## 2022-04-06 DIAGNOSIS — G3183 Dementia with Lewy bodies: Secondary | ICD-10-CM | POA: Diagnosis not present

## 2022-04-06 DIAGNOSIS — R3981 Functional urinary incontinence: Secondary | ICD-10-CM | POA: Diagnosis not present

## 2022-04-14 DIAGNOSIS — J411 Mucopurulent chronic bronchitis: Secondary | ICD-10-CM | POA: Diagnosis not present

## 2022-04-24 DIAGNOSIS — I129 Hypertensive chronic kidney disease with stage 1 through stage 4 chronic kidney disease, or unspecified chronic kidney disease: Secondary | ICD-10-CM | POA: Diagnosis not present

## 2022-04-24 DIAGNOSIS — J441 Chronic obstructive pulmonary disease with (acute) exacerbation: Secondary | ICD-10-CM | POA: Diagnosis not present

## 2022-05-14 DIAGNOSIS — J411 Mucopurulent chronic bronchitis: Secondary | ICD-10-CM | POA: Diagnosis not present

## 2022-05-22 DIAGNOSIS — R3981 Functional urinary incontinence: Secondary | ICD-10-CM | POA: Diagnosis not present

## 2022-05-22 DIAGNOSIS — N39490 Overflow incontinence: Secondary | ICD-10-CM | POA: Diagnosis not present

## 2022-05-22 DIAGNOSIS — R159 Full incontinence of feces: Secondary | ICD-10-CM | POA: Diagnosis not present

## 2022-05-22 DIAGNOSIS — G3183 Dementia with Lewy bodies: Secondary | ICD-10-CM | POA: Diagnosis not present

## 2022-05-25 DIAGNOSIS — I129 Hypertensive chronic kidney disease with stage 1 through stage 4 chronic kidney disease, or unspecified chronic kidney disease: Secondary | ICD-10-CM | POA: Diagnosis not present

## 2022-05-25 DIAGNOSIS — J441 Chronic obstructive pulmonary disease with (acute) exacerbation: Secondary | ICD-10-CM | POA: Diagnosis not present

## 2022-06-14 DIAGNOSIS — D509 Iron deficiency anemia, unspecified: Secondary | ICD-10-CM | POA: Diagnosis not present

## 2022-06-14 DIAGNOSIS — J411 Mucopurulent chronic bronchitis: Secondary | ICD-10-CM | POA: Diagnosis not present

## 2022-06-24 DIAGNOSIS — I129 Hypertensive chronic kidney disease with stage 1 through stage 4 chronic kidney disease, or unspecified chronic kidney disease: Secondary | ICD-10-CM | POA: Diagnosis not present

## 2022-06-24 DIAGNOSIS — J441 Chronic obstructive pulmonary disease with (acute) exacerbation: Secondary | ICD-10-CM | POA: Diagnosis not present

## 2022-06-30 ENCOUNTER — Other Ambulatory Visit: Payer: Self-pay

## 2022-06-30 ENCOUNTER — Telehealth: Payer: Self-pay

## 2022-06-30 DIAGNOSIS — G3183 Dementia with Lewy bodies: Secondary | ICD-10-CM | POA: Diagnosis not present

## 2022-06-30 DIAGNOSIS — R159 Full incontinence of feces: Secondary | ICD-10-CM | POA: Diagnosis not present

## 2022-06-30 DIAGNOSIS — Z789 Other specified health status: Secondary | ICD-10-CM

## 2022-06-30 DIAGNOSIS — N39490 Overflow incontinence: Secondary | ICD-10-CM | POA: Diagnosis not present

## 2022-06-30 DIAGNOSIS — R3981 Functional urinary incontinence: Secondary | ICD-10-CM | POA: Diagnosis not present

## 2022-06-30 NOTE — Telephone Encounter (Signed)
   Telephone encounter was:  Successful.  06/30/2022 Name: Tracy Clark MRN: [id number] DOB: [birth year]  Tracy Clark is a 81 y.o. year old female who is a primary care patient of Marco Collie, MD . The community resource team was consulted for assistance with Transportation Needs   Care guide performed the following interventions: Patient provided with information about care guide support team and interviewed to confirm resource needs. CG spoke to son as he stated pt has dementia. CG elevated level of services pt may need and determined ambulatory services and son agreed. Next appt 7/20.   Follow Up Plan:  Care guide will outreach resources to assist patient with transportation.  [facility] management  [place], [place]  Main Phone: [phone]  E-mail: [email or web address]  Website: [email or web address]

## 2022-06-30 NOTE — Patient Outreach (Signed)
[facility]) Care Management  06/30/2022  Tracy Clark [birth year] [id number]   Received faxed referral from PCP for Transportation needs as well as other resources. Assigned to [facility] care coordinator for follow up.  [facility] Management Assistant [phone]

## 2022-07-03 ENCOUNTER — Telehealth: Payer: Self-pay

## 2022-07-03 ENCOUNTER — Other Ambulatory Visit: Payer: Self-pay

## 2022-07-03 NOTE — Patient Outreach (Signed)
[facility]) Care Management  07/03/2022  Tracy Clark January 12, 1941 [id number]    Telephone Screen  Referral Date: 06/30/22 Referral Source: MD Office Referral Reason: "assistance with transportation-sent to care guides, son wants to see if patient qualifies for SNF Insurance: [employer] [facility]   Outreach attempt #1 to patient. Spoke with patient's son-[name] due to patient's dementia and discussed/reviewed referral. Son states that he has already been contacted by care guide regarding transportation and awaiting return call. Discussed and instructed son on how to verify transportation benefits eligibility through both [facility] and Medicaid and steps to set it up. He voiced understanding. Son reports that this will just be temporary until he recovers from surgery and is back able to drive. Discussed placement as mentioned in referral. Son adamant that he is not interested in placement at this time-maybe in the next 2-3 yrs if patient's dementia worsens. Son confirms that patient has both [employer] and Medicaid and eligible for CM services through insurance provider.    Plan: RN CM will close referral.    Enzo Montgomery, RN,BSN,CCM [facility] Management Telephonic Care Management Coordinator Direct Phone: [phone] Toll Free: [phone] Fax: [phone]

## 2022-07-04 ENCOUNTER — Telehealth: Payer: Self-pay

## 2022-07-04 DIAGNOSIS — D509 Iron deficiency anemia, unspecified: Secondary | ICD-10-CM | POA: Diagnosis not present

## 2022-07-04 NOTE — Telephone Encounter (Signed)
   Telephone encounter was:  Successful.  07/03/2022 Name: Tracy Clark MRN: [id number] DOB: [birth year]   Care guide performed the following interventions: Discussed resources to assist with Transportation .   [facility] management  [place], [place]  Main Phone: [phone]  E-mail: [email or web address]  Website: [email or web address]

## 2022-07-04 NOTE — Telephone Encounter (Signed)
   Telephone encounter was:  Successful.  07/04/2022 Name: Tracy Clark MRN: [id number] DOB: [birth year]  Tracy Clark is a 81 y.o. year old female who is a primary care patient of Marco Collie, MD . The community resource team was consulted for assistance with Transportation Needs   Care guide performed the following interventions: Follow up call placed to the patient to discuss status of referral. CG advised the transportation information for [facility]. Patient can ride with [employer]. There are 48 one-ways trips/24 round-way trips. Son has been advised how and when to schedule a ride with [facility] and will need to call [phone] to request rides 3 days before appointment. Pt and son understood and had no further questions at this time.  Follow Up Plan:  No further follow up planned at this time. The patient has been provided with needed resources.  [facility] management  [place], [place]  Main Phone: [phone]  E-mail: [email or web address]  Website: [email or web address]

## 2022-07-25 DIAGNOSIS — J441 Chronic obstructive pulmonary disease with (acute) exacerbation: Secondary | ICD-10-CM | POA: Diagnosis not present

## 2022-07-25 DIAGNOSIS — I129 Hypertensive chronic kidney disease with stage 1 through stage 4 chronic kidney disease, or unspecified chronic kidney disease: Secondary | ICD-10-CM | POA: Diagnosis not present

## 2022-08-04 DIAGNOSIS — I129 Hypertensive chronic kidney disease with stage 1 through stage 4 chronic kidney disease, or unspecified chronic kidney disease: Secondary | ICD-10-CM | POA: Diagnosis not present

## 2022-08-04 DIAGNOSIS — E785 Hyperlipidemia, unspecified: Secondary | ICD-10-CM | POA: Diagnosis not present

## 2022-08-04 DIAGNOSIS — D519 Vitamin B12 deficiency anemia, unspecified: Secondary | ICD-10-CM | POA: Diagnosis not present

## 2022-08-22 DIAGNOSIS — G3183 Dementia with Lewy bodies: Secondary | ICD-10-CM | POA: Diagnosis not present

## 2022-08-22 DIAGNOSIS — N39490 Overflow incontinence: Secondary | ICD-10-CM | POA: Diagnosis not present

## 2022-08-22 DIAGNOSIS — R159 Full incontinence of feces: Secondary | ICD-10-CM | POA: Diagnosis not present

## 2022-08-22 DIAGNOSIS — R3981 Functional urinary incontinence: Secondary | ICD-10-CM | POA: Diagnosis not present

## 2022-08-25 DIAGNOSIS — J441 Chronic obstructive pulmonary disease with (acute) exacerbation: Secondary | ICD-10-CM | POA: Diagnosis not present

## 2022-08-25 DIAGNOSIS — S51811A Laceration without foreign body of right forearm, initial encounter: Secondary | ICD-10-CM | POA: Diagnosis not present

## 2022-08-25 DIAGNOSIS — S41111A Laceration without foreign body of right upper arm, initial encounter: Secondary | ICD-10-CM | POA: Diagnosis not present

## 2022-08-25 DIAGNOSIS — W19XXXA Unspecified fall, initial encounter: Secondary | ICD-10-CM | POA: Diagnosis not present

## 2022-08-25 DIAGNOSIS — S41112A Laceration without foreign body of left upper arm, initial encounter: Secondary | ICD-10-CM | POA: Diagnosis not present

## 2022-08-25 DIAGNOSIS — R58 Hemorrhage, not elsewhere classified: Secondary | ICD-10-CM | POA: Diagnosis not present

## 2022-08-25 DIAGNOSIS — I129 Hypertensive chronic kidney disease with stage 1 through stage 4 chronic kidney disease, or unspecified chronic kidney disease: Secondary | ICD-10-CM | POA: Diagnosis not present

## 2022-08-25 DIAGNOSIS — F0390 Unspecified dementia without behavioral disturbance: Secondary | ICD-10-CM | POA: Diagnosis not present

## 2022-08-29 DIAGNOSIS — R82998 Other abnormal findings in urine: Secondary | ICD-10-CM | POA: Diagnosis not present

## 2022-08-29 DIAGNOSIS — Z9181 History of falling: Secondary | ICD-10-CM | POA: Diagnosis not present

## 2022-08-29 DIAGNOSIS — Z139 Encounter for screening, unspecified: Secondary | ICD-10-CM | POA: Diagnosis not present

## 2022-08-29 DIAGNOSIS — E785 Hyperlipidemia, unspecified: Secondary | ICD-10-CM | POA: Diagnosis not present

## 2022-08-29 DIAGNOSIS — Z0000 Encounter for general adult medical examination without abnormal findings: Secondary | ICD-10-CM | POA: Diagnosis not present

## 2022-08-29 DIAGNOSIS — Z1331 Encounter for screening for depression: Secondary | ICD-10-CM | POA: Diagnosis not present

## 2022-08-29 DIAGNOSIS — N1832 Chronic kidney disease, stage 3b: Secondary | ICD-10-CM | POA: Diagnosis not present

## 2022-08-29 DIAGNOSIS — E663 Overweight: Secondary | ICD-10-CM | POA: Diagnosis not present

## 2022-08-29 DIAGNOSIS — S41111A Laceration without foreign body of right upper arm, initial encounter: Secondary | ICD-10-CM | POA: Diagnosis not present

## 2022-09-04 DIAGNOSIS — Z4802 Encounter for removal of sutures: Secondary | ICD-10-CM | POA: Diagnosis not present

## 2022-09-04 DIAGNOSIS — S41111A Laceration without foreign body of right upper arm, initial encounter: Secondary | ICD-10-CM | POA: Diagnosis not present

## 2022-09-04 DIAGNOSIS — E46 Unspecified protein-calorie malnutrition: Secondary | ICD-10-CM | POA: Diagnosis not present

## 2022-09-04 DIAGNOSIS — Z6827 Body mass index (BMI) 27.0-27.9, adult: Secondary | ICD-10-CM | POA: Diagnosis not present

## 2022-09-04 DIAGNOSIS — R159 Full incontinence of feces: Secondary | ICD-10-CM | POA: Diagnosis not present

## 2022-09-24 DIAGNOSIS — J441 Chronic obstructive pulmonary disease with (acute) exacerbation: Secondary | ICD-10-CM | POA: Diagnosis not present

## 2022-09-24 DIAGNOSIS — I129 Hypertensive chronic kidney disease with stage 1 through stage 4 chronic kidney disease, or unspecified chronic kidney disease: Secondary | ICD-10-CM | POA: Diagnosis not present

## 2022-10-09 DIAGNOSIS — R3981 Functional urinary incontinence: Secondary | ICD-10-CM | POA: Diagnosis not present

## 2022-10-09 DIAGNOSIS — R159 Full incontinence of feces: Secondary | ICD-10-CM | POA: Diagnosis not present

## 2022-10-09 DIAGNOSIS — G3183 Dementia with Lewy bodies: Secondary | ICD-10-CM | POA: Diagnosis not present

## 2022-10-09 DIAGNOSIS — N39490 Overflow incontinence: Secondary | ICD-10-CM | POA: Diagnosis not present

## 2022-10-25 DIAGNOSIS — S0990XA Unspecified injury of head, initial encounter: Secondary | ICD-10-CM | POA: Diagnosis not present

## 2022-10-25 DIAGNOSIS — S0512XA Contusion of eyeball and orbital tissues, left eye, initial encounter: Secondary | ICD-10-CM | POA: Diagnosis not present

## 2022-10-25 DIAGNOSIS — G44309 Post-traumatic headache, unspecified, not intractable: Secondary | ICD-10-CM | POA: Diagnosis not present

## 2022-10-25 DIAGNOSIS — Z6828 Body mass index (BMI) 28.0-28.9, adult: Secondary | ICD-10-CM | POA: Diagnosis not present

## 2022-10-25 DIAGNOSIS — I129 Hypertensive chronic kidney disease with stage 1 through stage 4 chronic kidney disease, or unspecified chronic kidney disease: Secondary | ICD-10-CM | POA: Diagnosis not present

## 2022-10-25 DIAGNOSIS — R519 Headache, unspecified: Secondary | ICD-10-CM | POA: Diagnosis not present

## 2022-10-25 DIAGNOSIS — J441 Chronic obstructive pulmonary disease with (acute) exacerbation: Secondary | ICD-10-CM | POA: Diagnosis not present

## 2022-10-25 DIAGNOSIS — R296 Repeated falls: Secondary | ICD-10-CM | POA: Diagnosis not present

## 2022-11-24 DIAGNOSIS — I129 Hypertensive chronic kidney disease with stage 1 through stage 4 chronic kidney disease, or unspecified chronic kidney disease: Secondary | ICD-10-CM | POA: Diagnosis not present

## 2022-11-24 DIAGNOSIS — J441 Chronic obstructive pulmonary disease with (acute) exacerbation: Secondary | ICD-10-CM | POA: Diagnosis not present

## 2022-12-04 DIAGNOSIS — G3183 Dementia with Lewy bodies: Secondary | ICD-10-CM | POA: Diagnosis not present

## 2022-12-04 DIAGNOSIS — N39490 Overflow incontinence: Secondary | ICD-10-CM | POA: Diagnosis not present

## 2022-12-04 DIAGNOSIS — R3981 Functional urinary incontinence: Secondary | ICD-10-CM | POA: Diagnosis not present

## 2022-12-04 DIAGNOSIS — R159 Full incontinence of feces: Secondary | ICD-10-CM | POA: Diagnosis not present

## 2022-12-14 DIAGNOSIS — N390 Urinary tract infection, site not specified: Secondary | ICD-10-CM | POA: Diagnosis not present

## 2022-12-14 DIAGNOSIS — Z6827 Body mass index (BMI) 27.0-27.9, adult: Secondary | ICD-10-CM | POA: Diagnosis not present

## 2022-12-14 DIAGNOSIS — I5020 Unspecified systolic (congestive) heart failure: Secondary | ICD-10-CM | POA: Diagnosis not present

## 2022-12-14 DIAGNOSIS — R32 Unspecified urinary incontinence: Secondary | ICD-10-CM | POA: Diagnosis not present

## 2022-12-25 DIAGNOSIS — I129 Hypertensive chronic kidney disease with stage 1 through stage 4 chronic kidney disease, or unspecified chronic kidney disease: Secondary | ICD-10-CM | POA: Diagnosis not present

## 2022-12-25 DIAGNOSIS — J441 Chronic obstructive pulmonary disease with (acute) exacerbation: Secondary | ICD-10-CM | POA: Diagnosis not present

## 2023-01-15 DIAGNOSIS — N39490 Overflow incontinence: Secondary | ICD-10-CM | POA: Diagnosis not present

## 2023-01-15 DIAGNOSIS — R159 Full incontinence of feces: Secondary | ICD-10-CM | POA: Diagnosis not present

## 2023-01-15 DIAGNOSIS — G3183 Dementia with Lewy bodies: Secondary | ICD-10-CM | POA: Diagnosis not present

## 2023-01-15 DIAGNOSIS — R3981 Functional urinary incontinence: Secondary | ICD-10-CM | POA: Diagnosis not present

## 2023-01-25 DIAGNOSIS — J441 Chronic obstructive pulmonary disease with (acute) exacerbation: Secondary | ICD-10-CM | POA: Diagnosis not present

## 2023-01-25 DIAGNOSIS — I129 Hypertensive chronic kidney disease with stage 1 through stage 4 chronic kidney disease, or unspecified chronic kidney disease: Secondary | ICD-10-CM | POA: Diagnosis not present

## 2023-02-23 DIAGNOSIS — I129 Hypertensive chronic kidney disease with stage 1 through stage 4 chronic kidney disease, or unspecified chronic kidney disease: Secondary | ICD-10-CM | POA: Diagnosis not present

## 2023-02-23 DIAGNOSIS — J441 Chronic obstructive pulmonary disease with (acute) exacerbation: Secondary | ICD-10-CM | POA: Diagnosis not present

## 2023-03-25 DIAGNOSIS — J449 Chronic obstructive pulmonary disease, unspecified: Secondary | ICD-10-CM | POA: Diagnosis not present

## 2023-03-25 DIAGNOSIS — I130 Hypertensive heart and chronic kidney disease with heart failure and stage 1 through stage 4 chronic kidney disease, or unspecified chronic kidney disease: Secondary | ICD-10-CM | POA: Diagnosis not present

## 2023-03-25 DIAGNOSIS — R079 Chest pain, unspecified: Secondary | ICD-10-CM | POA: Diagnosis not present

## 2023-03-25 DIAGNOSIS — R110 Nausea: Secondary | ICD-10-CM | POA: Diagnosis not present

## 2023-03-25 DIAGNOSIS — R109 Unspecified abdominal pain: Secondary | ICD-10-CM | POA: Diagnosis not present

## 2023-03-25 DIAGNOSIS — I509 Heart failure, unspecified: Secondary | ICD-10-CM | POA: Diagnosis not present

## 2023-03-25 DIAGNOSIS — K449 Diaphragmatic hernia without obstruction or gangrene: Secondary | ICD-10-CM | POA: Diagnosis not present

## 2023-03-25 DIAGNOSIS — N3289 Other specified disorders of bladder: Secondary | ICD-10-CM | POA: Diagnosis not present

## 2023-03-25 DIAGNOSIS — N183 Chronic kidney disease, stage 3 unspecified: Secondary | ICD-10-CM | POA: Diagnosis not present

## 2023-03-25 DIAGNOSIS — N3000 Acute cystitis without hematuria: Secondary | ICD-10-CM | POA: Diagnosis not present

## 2023-03-25 DIAGNOSIS — R9431 Abnormal electrocardiogram [ECG] [EKG]: Secondary | ICD-10-CM | POA: Diagnosis not present

## 2023-03-25 DIAGNOSIS — R0789 Other chest pain: Secondary | ICD-10-CM | POA: Diagnosis not present

## 2023-03-25 DIAGNOSIS — R1084 Generalized abdominal pain: Secondary | ICD-10-CM | POA: Diagnosis not present

## 2023-03-26 DIAGNOSIS — J441 Chronic obstructive pulmonary disease with (acute) exacerbation: Secondary | ICD-10-CM | POA: Diagnosis not present

## 2023-03-26 DIAGNOSIS — I129 Hypertensive chronic kidney disease with stage 1 through stage 4 chronic kidney disease, or unspecified chronic kidney disease: Secondary | ICD-10-CM | POA: Diagnosis not present

## 2023-03-29 ENCOUNTER — Telehealth: Payer: Self-pay | Admitting: *Deleted

## 2023-03-29 NOTE — Telephone Encounter (Signed)
     Patient  visit on 03/25/2023  at [facility] ed  was for treatment   Have you been able to follow up with your primary care physician? patient not feeling better , will take her back if needed she has her medications and transportation . Thinks patient ate bad food  The patient was  able to obtain any needed medicine or equipment.  Are there diet recommendations that you are having difficulty following?  Patient expresses understanding of discharge instructions and education provided has no other needs at this time.    [place] [phone] [address] , [place] [address] Email : [email or web address]

## 2023-04-16 DIAGNOSIS — N39490 Overflow incontinence: Secondary | ICD-10-CM | POA: Diagnosis not present

## 2023-04-16 DIAGNOSIS — R159 Full incontinence of feces: Secondary | ICD-10-CM | POA: Diagnosis not present

## 2023-04-16 DIAGNOSIS — G3183 Dementia with Lewy bodies: Secondary | ICD-10-CM | POA: Diagnosis not present

## 2023-04-16 DIAGNOSIS — R3981 Functional urinary incontinence: Secondary | ICD-10-CM | POA: Diagnosis not present

## 2023-04-25 DIAGNOSIS — I129 Hypertensive chronic kidney disease with stage 1 through stage 4 chronic kidney disease, or unspecified chronic kidney disease: Secondary | ICD-10-CM | POA: Diagnosis not present

## 2023-04-25 DIAGNOSIS — J441 Chronic obstructive pulmonary disease with (acute) exacerbation: Secondary | ICD-10-CM | POA: Diagnosis not present

## 2023-05-01 DIAGNOSIS — D519 Vitamin B12 deficiency anemia, unspecified: Secondary | ICD-10-CM | POA: Diagnosis not present

## 2023-05-01 DIAGNOSIS — E785 Hyperlipidemia, unspecified: Secondary | ICD-10-CM | POA: Diagnosis not present

## 2023-05-01 DIAGNOSIS — I129 Hypertensive chronic kidney disease with stage 1 through stage 4 chronic kidney disease, or unspecified chronic kidney disease: Secondary | ICD-10-CM | POA: Diagnosis not present

## 2023-05-26 DIAGNOSIS — I129 Hypertensive chronic kidney disease with stage 1 through stage 4 chronic kidney disease, or unspecified chronic kidney disease: Secondary | ICD-10-CM | POA: Diagnosis not present

## 2023-05-26 DIAGNOSIS — J441 Chronic obstructive pulmonary disease with (acute) exacerbation: Secondary | ICD-10-CM | POA: Diagnosis not present

## 2023-06-07 DIAGNOSIS — R82998 Other abnormal findings in urine: Secondary | ICD-10-CM | POA: Diagnosis not present

## 2023-06-07 DIAGNOSIS — B028 Zoster with other complications: Secondary | ICD-10-CM | POA: Diagnosis not present

## 2023-06-07 DIAGNOSIS — R300 Dysuria: Secondary | ICD-10-CM | POA: Diagnosis not present

## 2023-06-07 DIAGNOSIS — L308 Other specified dermatitis: Secondary | ICD-10-CM | POA: Diagnosis not present

## 2023-06-07 DIAGNOSIS — Z6824 Body mass index (BMI) 24.0-24.9, adult: Secondary | ICD-10-CM | POA: Diagnosis not present

## 2023-06-18 DIAGNOSIS — N39490 Overflow incontinence: Secondary | ICD-10-CM | POA: Diagnosis not present

## 2023-06-18 DIAGNOSIS — R3981 Functional urinary incontinence: Secondary | ICD-10-CM | POA: Diagnosis not present

## 2023-06-18 DIAGNOSIS — G3183 Dementia with Lewy bodies: Secondary | ICD-10-CM | POA: Diagnosis not present

## 2023-06-18 DIAGNOSIS — R159 Full incontinence of feces: Secondary | ICD-10-CM | POA: Diagnosis not present

## 2023-06-21 DIAGNOSIS — J449 Chronic obstructive pulmonary disease, unspecified: Secondary | ICD-10-CM | POA: Diagnosis not present

## 2023-06-21 DIAGNOSIS — I5020 Unspecified systolic (congestive) heart failure: Secondary | ICD-10-CM | POA: Diagnosis not present

## 2023-06-21 DIAGNOSIS — Z6824 Body mass index (BMI) 24.0-24.9, adult: Secondary | ICD-10-CM | POA: Diagnosis not present

## 2023-06-21 DIAGNOSIS — N390 Urinary tract infection, site not specified: Secondary | ICD-10-CM | POA: Diagnosis not present

## 2023-06-21 DIAGNOSIS — D692 Other nonthrombocytopenic purpura: Secondary | ICD-10-CM | POA: Diagnosis not present

## 2023-06-21 DIAGNOSIS — F0150 Vascular dementia without behavioral disturbance: Secondary | ICD-10-CM | POA: Diagnosis not present

## 2023-06-25 DIAGNOSIS — J441 Chronic obstructive pulmonary disease with (acute) exacerbation: Secondary | ICD-10-CM | POA: Diagnosis not present

## 2023-06-25 DIAGNOSIS — I129 Hypertensive chronic kidney disease with stage 1 through stage 4 chronic kidney disease, or unspecified chronic kidney disease: Secondary | ICD-10-CM | POA: Diagnosis not present

## 2023-07-26 DIAGNOSIS — J441 Chronic obstructive pulmonary disease with (acute) exacerbation: Secondary | ICD-10-CM | POA: Diagnosis not present

## 2023-07-26 DIAGNOSIS — I129 Hypertensive chronic kidney disease with stage 1 through stage 4 chronic kidney disease, or unspecified chronic kidney disease: Secondary | ICD-10-CM | POA: Diagnosis not present

## 2023-07-30 DIAGNOSIS — R32 Unspecified urinary incontinence: Secondary | ICD-10-CM | POA: Diagnosis not present

## 2023-07-30 DIAGNOSIS — N390 Urinary tract infection, site not specified: Secondary | ICD-10-CM | POA: Diagnosis not present

## 2023-07-30 DIAGNOSIS — Z6824 Body mass index (BMI) 24.0-24.9, adult: Secondary | ICD-10-CM | POA: Diagnosis not present

## 2023-08-03 DIAGNOSIS — N39490 Overflow incontinence: Secondary | ICD-10-CM | POA: Diagnosis not present

## 2023-08-03 DIAGNOSIS — R159 Full incontinence of feces: Secondary | ICD-10-CM | POA: Diagnosis not present

## 2023-08-03 DIAGNOSIS — G3183 Dementia with Lewy bodies: Secondary | ICD-10-CM | POA: Diagnosis not present

## 2023-08-03 DIAGNOSIS — R3981 Functional urinary incontinence: Secondary | ICD-10-CM | POA: Diagnosis not present

## 2023-09-03 DIAGNOSIS — R32 Unspecified urinary incontinence: Secondary | ICD-10-CM | POA: Diagnosis not present

## 2023-09-03 DIAGNOSIS — R82998 Other abnormal findings in urine: Secondary | ICD-10-CM | POA: Diagnosis not present

## 2023-09-03 DIAGNOSIS — Z6824 Body mass index (BMI) 24.0-24.9, adult: Secondary | ICD-10-CM | POA: Diagnosis not present

## 2023-09-12 DIAGNOSIS — R159 Full incontinence of feces: Secondary | ICD-10-CM | POA: Diagnosis not present

## 2023-09-12 DIAGNOSIS — G3183 Dementia with Lewy bodies: Secondary | ICD-10-CM | POA: Diagnosis not present

## 2023-09-12 DIAGNOSIS — R3981 Functional urinary incontinence: Secondary | ICD-10-CM | POA: Diagnosis not present

## 2023-09-12 DIAGNOSIS — N39490 Overflow incontinence: Secondary | ICD-10-CM | POA: Diagnosis not present

## 2023-09-25 DIAGNOSIS — F03B0 Unspecified dementia, moderate, without behavioral disturbance, psychotic disturbance, mood disturbance, and anxiety: Secondary | ICD-10-CM | POA: Diagnosis not present

## 2023-09-25 DIAGNOSIS — R42 Dizziness and giddiness: Secondary | ICD-10-CM | POA: Diagnosis not present

## 2023-09-25 DIAGNOSIS — I4510 Unspecified right bundle-branch block: Secondary | ICD-10-CM | POA: Diagnosis not present

## 2023-09-25 DIAGNOSIS — I959 Hypotension, unspecified: Secondary | ICD-10-CM | POA: Diagnosis not present

## 2023-09-25 DIAGNOSIS — R531 Weakness: Secondary | ICD-10-CM | POA: Diagnosis not present

## 2023-09-25 DIAGNOSIS — I129 Hypertensive chronic kidney disease with stage 1 through stage 4 chronic kidney disease, or unspecified chronic kidney disease: Secondary | ICD-10-CM | POA: Diagnosis not present

## 2023-09-25 DIAGNOSIS — R630 Anorexia: Secondary | ICD-10-CM | POA: Diagnosis not present

## 2023-09-25 DIAGNOSIS — J441 Chronic obstructive pulmonary disease with (acute) exacerbation: Secondary | ICD-10-CM | POA: Diagnosis not present

## 2023-09-25 DIAGNOSIS — K449 Diaphragmatic hernia without obstruction or gangrene: Secondary | ICD-10-CM | POA: Diagnosis not present

## 2023-09-25 DIAGNOSIS — R55 Syncope and collapse: Secondary | ICD-10-CM | POA: Diagnosis not present

## 2023-09-25 DIAGNOSIS — R262 Difficulty in walking, not elsewhere classified: Secondary | ICD-10-CM | POA: Diagnosis not present

## 2023-10-26 DIAGNOSIS — J441 Chronic obstructive pulmonary disease with (acute) exacerbation: Secondary | ICD-10-CM | POA: Diagnosis not present

## 2023-10-26 DIAGNOSIS — I129 Hypertensive chronic kidney disease with stage 1 through stage 4 chronic kidney disease, or unspecified chronic kidney disease: Secondary | ICD-10-CM | POA: Diagnosis not present

## 2023-10-30 DIAGNOSIS — E785 Hyperlipidemia, unspecified: Secondary | ICD-10-CM | POA: Diagnosis not present

## 2023-10-30 DIAGNOSIS — D519 Vitamin B12 deficiency anemia, unspecified: Secondary | ICD-10-CM | POA: Diagnosis not present

## 2023-10-30 DIAGNOSIS — I129 Hypertensive chronic kidney disease with stage 1 through stage 4 chronic kidney disease, or unspecified chronic kidney disease: Secondary | ICD-10-CM | POA: Diagnosis not present

## 2023-11-06 DIAGNOSIS — E669 Obesity, unspecified: Secondary | ICD-10-CM | POA: Diagnosis not present

## 2023-11-06 DIAGNOSIS — I129 Hypertensive chronic kidney disease with stage 1 through stage 4 chronic kidney disease, or unspecified chronic kidney disease: Secondary | ICD-10-CM | POA: Diagnosis not present

## 2023-11-06 DIAGNOSIS — Z139 Encounter for screening, unspecified: Secondary | ICD-10-CM | POA: Diagnosis not present

## 2023-11-06 DIAGNOSIS — N182 Chronic kidney disease, stage 2 (mild): Secondary | ICD-10-CM | POA: Diagnosis not present

## 2023-11-06 DIAGNOSIS — Z1331 Encounter for screening for depression: Secondary | ICD-10-CM | POA: Diagnosis not present

## 2023-11-06 DIAGNOSIS — Z0000 Encounter for general adult medical examination without abnormal findings: Secondary | ICD-10-CM | POA: Diagnosis not present

## 2023-11-06 DIAGNOSIS — F0150 Vascular dementia without behavioral disturbance: Secondary | ICD-10-CM | POA: Diagnosis not present

## 2023-11-06 DIAGNOSIS — E785 Hyperlipidemia, unspecified: Secondary | ICD-10-CM | POA: Diagnosis not present

## 2023-11-06 DIAGNOSIS — Z23 Encounter for immunization: Secondary | ICD-10-CM | POA: Diagnosis not present

## 2023-11-13 DIAGNOSIS — N39490 Overflow incontinence: Secondary | ICD-10-CM | POA: Diagnosis not present

## 2023-11-13 DIAGNOSIS — R159 Full incontinence of feces: Secondary | ICD-10-CM | POA: Diagnosis not present

## 2023-11-13 DIAGNOSIS — G3183 Dementia with Lewy bodies: Secondary | ICD-10-CM | POA: Diagnosis not present

## 2023-11-13 DIAGNOSIS — R3981 Functional urinary incontinence: Secondary | ICD-10-CM | POA: Diagnosis not present

## 2023-11-13 DIAGNOSIS — D519 Vitamin B12 deficiency anemia, unspecified: Secondary | ICD-10-CM | POA: Diagnosis not present

## 2023-11-25 DIAGNOSIS — J441 Chronic obstructive pulmonary disease with (acute) exacerbation: Secondary | ICD-10-CM | POA: Diagnosis not present

## 2023-11-25 DIAGNOSIS — I129 Hypertensive chronic kidney disease with stage 1 through stage 4 chronic kidney disease, or unspecified chronic kidney disease: Secondary | ICD-10-CM | POA: Diagnosis not present

## 2023-12-06 DIAGNOSIS — D519 Vitamin B12 deficiency anemia, unspecified: Secondary | ICD-10-CM | POA: Diagnosis not present

## 2023-12-20 DIAGNOSIS — R300 Dysuria: Secondary | ICD-10-CM | POA: Diagnosis not present

## 2023-12-20 DIAGNOSIS — D519 Vitamin B12 deficiency anemia, unspecified: Secondary | ICD-10-CM | POA: Diagnosis not present

## 2023-12-20 DIAGNOSIS — Z6822 Body mass index (BMI) 22.0-22.9, adult: Secondary | ICD-10-CM | POA: Diagnosis not present

## 2023-12-21 DIAGNOSIS — N390 Urinary tract infection, site not specified: Secondary | ICD-10-CM | POA: Diagnosis not present

## 2023-12-21 DIAGNOSIS — R82998 Other abnormal findings in urine: Secondary | ICD-10-CM | POA: Diagnosis not present

## 2023-12-26 DIAGNOSIS — J441 Chronic obstructive pulmonary disease with (acute) exacerbation: Secondary | ICD-10-CM | POA: Diagnosis not present

## 2023-12-26 DIAGNOSIS — Z5321 Procedure and treatment not carried out due to patient leaving prior to being seen by health care provider: Secondary | ICD-10-CM | POA: Diagnosis not present

## 2023-12-26 DIAGNOSIS — I129 Hypertensive chronic kidney disease with stage 1 through stage 4 chronic kidney disease, or unspecified chronic kidney disease: Secondary | ICD-10-CM | POA: Diagnosis not present

## 2023-12-31 ENCOUNTER — Other Ambulatory Visit: Payer: Self-pay | Admitting: Family Medicine

## 2023-12-31 DIAGNOSIS — Z1231 Encounter for screening mammogram for malignant neoplasm of breast: Secondary | ICD-10-CM

## 2024-01-02 ENCOUNTER — Inpatient Hospital Stay: Admission: RE | Admit: 2024-01-02 | Payer: Medicare HMO | Source: Ambulatory Visit

## 2024-01-06 DIAGNOSIS — Z9981 Dependence on supplemental oxygen: Secondary | ICD-10-CM | POA: Diagnosis not present

## 2024-01-26 DIAGNOSIS — I129 Hypertensive chronic kidney disease with stage 1 through stage 4 chronic kidney disease, or unspecified chronic kidney disease: Secondary | ICD-10-CM | POA: Diagnosis not present

## 2024-01-26 DIAGNOSIS — J441 Chronic obstructive pulmonary disease with (acute) exacerbation: Secondary | ICD-10-CM | POA: Diagnosis not present

## 2024-02-06 DIAGNOSIS — Z9981 Dependence on supplemental oxygen: Secondary | ICD-10-CM | POA: Diagnosis not present

## 2024-02-23 DIAGNOSIS — I129 Hypertensive chronic kidney disease with stage 1 through stage 4 chronic kidney disease, or unspecified chronic kidney disease: Secondary | ICD-10-CM | POA: Diagnosis not present

## 2024-02-23 DIAGNOSIS — J441 Chronic obstructive pulmonary disease with (acute) exacerbation: Secondary | ICD-10-CM | POA: Diagnosis not present

## 2024-02-25 DIAGNOSIS — Z6822 Body mass index (BMI) 22.0-22.9, adult: Secondary | ICD-10-CM | POA: Diagnosis not present

## 2024-02-25 DIAGNOSIS — E785 Hyperlipidemia, unspecified: Secondary | ICD-10-CM | POA: Diagnosis not present

## 2024-02-25 DIAGNOSIS — R82998 Other abnormal findings in urine: Secondary | ICD-10-CM | POA: Diagnosis not present

## 2024-02-25 DIAGNOSIS — D519 Vitamin B12 deficiency anemia, unspecified: Secondary | ICD-10-CM | POA: Diagnosis not present

## 2024-03-03 DIAGNOSIS — D692 Other nonthrombocytopenic purpura: Secondary | ICD-10-CM | POA: Diagnosis not present

## 2024-03-03 DIAGNOSIS — Z6822 Body mass index (BMI) 22.0-22.9, adult: Secondary | ICD-10-CM | POA: Diagnosis not present

## 2024-03-03 DIAGNOSIS — N182 Chronic kidney disease, stage 2 (mild): Secondary | ICD-10-CM | POA: Diagnosis not present

## 2024-03-03 DIAGNOSIS — I129 Hypertensive chronic kidney disease with stage 1 through stage 4 chronic kidney disease, or unspecified chronic kidney disease: Secondary | ICD-10-CM | POA: Diagnosis not present

## 2024-03-05 DIAGNOSIS — Z9981 Dependence on supplemental oxygen: Secondary | ICD-10-CM | POA: Diagnosis not present

## 2024-03-25 DIAGNOSIS — J441 Chronic obstructive pulmonary disease with (acute) exacerbation: Secondary | ICD-10-CM | POA: Diagnosis not present

## 2024-03-25 DIAGNOSIS — E785 Hyperlipidemia, unspecified: Secondary | ICD-10-CM | POA: Diagnosis not present

## 2024-04-04 DIAGNOSIS — N390 Urinary tract infection, site not specified: Secondary | ICD-10-CM | POA: Diagnosis not present

## 2024-04-04 DIAGNOSIS — Z6822 Body mass index (BMI) 22.0-22.9, adult: Secondary | ICD-10-CM | POA: Diagnosis not present

## 2024-04-04 DIAGNOSIS — R350 Frequency of micturition: Secondary | ICD-10-CM | POA: Diagnosis not present

## 2024-04-05 DIAGNOSIS — Z9981 Dependence on supplemental oxygen: Secondary | ICD-10-CM | POA: Diagnosis not present

## 2024-04-24 DIAGNOSIS — E785 Hyperlipidemia, unspecified: Secondary | ICD-10-CM | POA: Diagnosis not present

## 2024-05-05 DIAGNOSIS — Z9981 Dependence on supplemental oxygen: Secondary | ICD-10-CM | POA: Diagnosis not present

## 2024-05-25 DIAGNOSIS — E785 Hyperlipidemia, unspecified: Secondary | ICD-10-CM | POA: Diagnosis not present

## 2024-05-25 DIAGNOSIS — J441 Chronic obstructive pulmonary disease with (acute) exacerbation: Secondary | ICD-10-CM | POA: Diagnosis not present

## 2024-06-05 DIAGNOSIS — Z9981 Dependence on supplemental oxygen: Secondary | ICD-10-CM | POA: Diagnosis not present

## 2024-06-24 DIAGNOSIS — J441 Chronic obstructive pulmonary disease with (acute) exacerbation: Secondary | ICD-10-CM | POA: Diagnosis not present

## 2024-06-24 DIAGNOSIS — E785 Hyperlipidemia, unspecified: Secondary | ICD-10-CM | POA: Diagnosis not present

## 2024-07-05 DIAGNOSIS — Z9981 Dependence on supplemental oxygen: Secondary | ICD-10-CM | POA: Diagnosis not present

## 2024-07-25 DIAGNOSIS — J441 Chronic obstructive pulmonary disease with (acute) exacerbation: Secondary | ICD-10-CM | POA: Diagnosis not present

## 2024-07-25 DIAGNOSIS — E785 Hyperlipidemia, unspecified: Secondary | ICD-10-CM | POA: Diagnosis not present

## 2024-08-05 DIAGNOSIS — Z9981 Dependence on supplemental oxygen: Secondary | ICD-10-CM | POA: Diagnosis not present

## 2024-08-25 DIAGNOSIS — E785 Hyperlipidemia, unspecified: Secondary | ICD-10-CM | POA: Diagnosis not present

## 2024-08-25 DIAGNOSIS — J441 Chronic obstructive pulmonary disease with (acute) exacerbation: Secondary | ICD-10-CM | POA: Diagnosis not present

## 2024-09-05 DIAGNOSIS — Z9981 Dependence on supplemental oxygen: Secondary | ICD-10-CM | POA: Diagnosis not present

## 2024-09-16 DIAGNOSIS — D519 Vitamin B12 deficiency anemia, unspecified: Secondary | ICD-10-CM | POA: Diagnosis not present

## 2024-09-16 DIAGNOSIS — Z23 Encounter for immunization: Secondary | ICD-10-CM | POA: Diagnosis not present

## 2024-09-16 DIAGNOSIS — N182 Chronic kidney disease, stage 2 (mild): Secondary | ICD-10-CM | POA: Diagnosis not present

## 2024-09-16 DIAGNOSIS — Z6821 Body mass index (BMI) 21.0-21.9, adult: Secondary | ICD-10-CM | POA: Diagnosis not present

## 2024-09-16 DIAGNOSIS — R300 Dysuria: Secondary | ICD-10-CM | POA: Diagnosis not present

## 2024-09-16 DIAGNOSIS — I2510 Atherosclerotic heart disease of native coronary artery without angina pectoris: Secondary | ICD-10-CM | POA: Diagnosis not present

## 2024-09-16 DIAGNOSIS — N390 Urinary tract infection, site not specified: Secondary | ICD-10-CM | POA: Diagnosis not present

## 2024-09-17 DIAGNOSIS — R82998 Other abnormal findings in urine: Secondary | ICD-10-CM | POA: Diagnosis not present

## 2024-09-24 DIAGNOSIS — E785 Hyperlipidemia, unspecified: Secondary | ICD-10-CM | POA: Diagnosis not present

## 2024-09-24 DIAGNOSIS — J441 Chronic obstructive pulmonary disease with (acute) exacerbation: Secondary | ICD-10-CM | POA: Diagnosis not present

## 2024-10-05 DIAGNOSIS — Z9981 Dependence on supplemental oxygen: Secondary | ICD-10-CM | POA: Diagnosis not present

## 2024-10-25 DIAGNOSIS — E785 Hyperlipidemia, unspecified: Secondary | ICD-10-CM | POA: Diagnosis not present

## 2024-10-25 DIAGNOSIS — J441 Chronic obstructive pulmonary disease with (acute) exacerbation: Secondary | ICD-10-CM | POA: Diagnosis not present
